# Patient Record
Sex: Male | Born: 1935 | Race: White | Hispanic: No | Marital: Married | Smoking: Never smoker
Health system: Southern US, Community
[De-identification: ages and names within clinical notes are randomized; demographics above are authoritative.]

## PROBLEM LIST (undated history)

## (undated) DIAGNOSIS — G4733 Obstructive sleep apnea (adult) (pediatric): Secondary | ICD-10-CM

## (undated) DIAGNOSIS — I739 Peripheral vascular disease, unspecified: Secondary | ICD-10-CM

## (undated) DIAGNOSIS — E119 Type 2 diabetes mellitus without complications: Secondary | ICD-10-CM

## (undated) DIAGNOSIS — I5030 Unspecified diastolic (congestive) heart failure: Secondary | ICD-10-CM

## (undated) DIAGNOSIS — H548 Legal blindness, as defined in USA: Secondary | ICD-10-CM

## (undated) DIAGNOSIS — R001 Bradycardia, unspecified: Secondary | ICD-10-CM

## (undated) DIAGNOSIS — F0280 Dementia in other diseases classified elsewhere without behavioral disturbance: Secondary | ICD-10-CM

## (undated) DIAGNOSIS — I2510 Atherosclerotic heart disease of native coronary artery without angina pectoris: Secondary | ICD-10-CM

## (undated) DIAGNOSIS — G309 Alzheimer's disease, unspecified: Secondary | ICD-10-CM

## (undated) DIAGNOSIS — E782 Mixed hyperlipidemia: Secondary | ICD-10-CM

## (undated) HISTORY — DX: Dementia in other diseases classified elsewhere, unspecified severity, without behavioral disturbance, psychotic disturbance, mood disturbance, and anxiety: F02.80

## (undated) HISTORY — DX: Bradycardia, unspecified: R00.1

## (undated) HISTORY — PX: CHOLECYSTECTOMY: SHX55

## (undated) HISTORY — DX: Type 2 diabetes mellitus without complications: E11.9

## (undated) HISTORY — DX: Unspecified diastolic (congestive) heart failure: I50.30

## (undated) HISTORY — PX: APPENDECTOMY: SHX54

## (undated) HISTORY — DX: Legal blindness, as defined in USA: H54.8

## (undated) HISTORY — DX: Mixed hyperlipidemia: E78.2

## (undated) HISTORY — DX: Obstructive sleep apnea (adult) (pediatric): G47.33

## (undated) HISTORY — DX: Peripheral vascular disease, unspecified: I73.9

## (undated) HISTORY — DX: Alzheimer's disease, unspecified: G30.9

## (undated) HISTORY — PX: OTHER SURGICAL HISTORY: SHX169

## (undated) HISTORY — DX: Atherosclerotic heart disease of native coronary artery without angina pectoris: I25.10

---

## 1998-07-31 ENCOUNTER — Ambulatory Visit (HOSPITAL_COMMUNITY): Admission: RE | Admit: 1998-07-31 | Discharge: 1998-07-31 | Payer: Self-pay | Admitting: Ophthalmology

## 2005-01-17 ENCOUNTER — Encounter: Payer: Self-pay | Admitting: Cardiology

## 2005-01-17 ENCOUNTER — Inpatient Hospital Stay (HOSPITAL_COMMUNITY): Admission: AD | Admit: 2005-01-17 | Discharge: 2005-01-20 | Payer: Self-pay | Admitting: Cardiology

## 2005-01-17 ENCOUNTER — Ambulatory Visit: Payer: Self-pay | Admitting: Internal Medicine

## 2005-01-17 HISTORY — PX: OTHER SURGICAL HISTORY: SHX169

## 2005-01-29 ENCOUNTER — Ambulatory Visit: Payer: Self-pay

## 2005-01-29 ENCOUNTER — Ambulatory Visit: Payer: Self-pay | Admitting: Internal Medicine

## 2005-01-30 ENCOUNTER — Ambulatory Visit: Payer: Self-pay | Admitting: Cardiology

## 2005-02-05 ENCOUNTER — Ambulatory Visit: Payer: Self-pay | Admitting: Cardiology

## 2005-12-02 ENCOUNTER — Ambulatory Visit: Payer: Self-pay | Admitting: Cardiology

## 2005-12-18 ENCOUNTER — Ambulatory Visit: Payer: Self-pay | Admitting: Cardiology

## 2006-01-13 ENCOUNTER — Ambulatory Visit: Payer: Self-pay | Admitting: Internal Medicine

## 2006-01-28 ENCOUNTER — Ambulatory Visit: Payer: Self-pay | Admitting: Cardiology

## 2006-02-23 ENCOUNTER — Ambulatory Visit: Payer: Self-pay | Admitting: Cardiology

## 2006-05-13 ENCOUNTER — Ambulatory Visit: Payer: Self-pay | Admitting: Internal Medicine

## 2006-12-14 ENCOUNTER — Ambulatory Visit: Payer: Self-pay | Admitting: Cardiology

## 2007-03-24 ENCOUNTER — Ambulatory Visit: Payer: Self-pay | Admitting: Cardiology

## 2007-03-29 ENCOUNTER — Inpatient Hospital Stay (HOSPITAL_BASED_OUTPATIENT_CLINIC_OR_DEPARTMENT_OTHER): Admission: RE | Admit: 2007-03-29 | Discharge: 2007-03-29 | Payer: Self-pay | Admitting: Cardiology

## 2007-03-29 ENCOUNTER — Ambulatory Visit: Payer: Self-pay | Admitting: Cardiology

## 2007-04-01 ENCOUNTER — Inpatient Hospital Stay (HOSPITAL_COMMUNITY): Admission: RE | Admit: 2007-04-01 | Discharge: 2007-04-02 | Payer: Self-pay | Admitting: Cardiology

## 2007-04-01 ENCOUNTER — Ambulatory Visit: Payer: Self-pay | Admitting: Cardiovascular Disease

## 2007-04-20 ENCOUNTER — Ambulatory Visit: Payer: Self-pay | Admitting: Nurse Practitioner

## 2007-06-01 ENCOUNTER — Ambulatory Visit: Payer: Self-pay | Admitting: Cardiology

## 2007-06-07 ENCOUNTER — Ambulatory Visit: Payer: Self-pay | Admitting: Cardiology

## 2007-08-12 ENCOUNTER — Ambulatory Visit: Payer: Self-pay | Admitting: Cardiology

## 2007-12-17 ENCOUNTER — Ambulatory Visit: Payer: Self-pay | Admitting: Cardiology

## 2008-01-21 ENCOUNTER — Ambulatory Visit: Payer: Self-pay | Admitting: Cardiology

## 2008-02-18 ENCOUNTER — Ambulatory Visit: Payer: Self-pay | Admitting: Cardiology

## 2008-02-25 ENCOUNTER — Ambulatory Visit: Payer: Self-pay | Admitting: Internal Medicine

## 2008-03-06 ENCOUNTER — Ambulatory Visit: Payer: Self-pay | Admitting: Cardiology

## 2008-06-15 ENCOUNTER — Ambulatory Visit: Payer: Self-pay | Admitting: Internal Medicine

## 2008-09-19 ENCOUNTER — Ambulatory Visit: Payer: Self-pay | Admitting: Internal Medicine

## 2008-10-03 ENCOUNTER — Ambulatory Visit: Payer: Self-pay | Admitting: Cardiology

## 2009-01-19 ENCOUNTER — Ambulatory Visit: Payer: Self-pay | Admitting: Internal Medicine

## 2009-01-19 ENCOUNTER — Encounter: Payer: Self-pay | Admitting: Cardiology

## 2009-01-22 ENCOUNTER — Encounter: Payer: Self-pay | Admitting: Internal Medicine

## 2009-01-23 ENCOUNTER — Encounter: Payer: Self-pay | Admitting: Cardiology

## 2009-02-19 ENCOUNTER — Encounter: Payer: Self-pay | Admitting: Cardiology

## 2009-03-22 ENCOUNTER — Ambulatory Visit: Payer: Self-pay | Admitting: Cardiology

## 2009-03-24 ENCOUNTER — Encounter: Payer: Self-pay | Admitting: Cardiology

## 2009-05-23 ENCOUNTER — Encounter: Payer: Self-pay | Admitting: Cardiology

## 2009-07-11 ENCOUNTER — Ambulatory Visit: Payer: Self-pay | Admitting: Internal Medicine

## 2009-08-23 ENCOUNTER — Ambulatory Visit: Payer: Self-pay | Admitting: Cardiology

## 2009-08-23 ENCOUNTER — Encounter: Payer: Self-pay | Admitting: Cardiology

## 2009-08-23 ENCOUNTER — Encounter: Payer: Self-pay | Admitting: Physician Assistant

## 2009-08-31 ENCOUNTER — Encounter: Payer: Self-pay | Admitting: Cardiology

## 2009-09-26 ENCOUNTER — Encounter: Payer: Self-pay | Admitting: Cardiology

## 2009-09-26 DIAGNOSIS — I5031 Acute diastolic (congestive) heart failure: Secondary | ICD-10-CM | POA: Insufficient documentation

## 2009-09-26 DIAGNOSIS — F0280 Dementia in other diseases classified elsewhere without behavioral disturbance: Secondary | ICD-10-CM | POA: Insufficient documentation

## 2009-09-26 DIAGNOSIS — I2510 Atherosclerotic heart disease of native coronary artery without angina pectoris: Secondary | ICD-10-CM

## 2009-09-26 DIAGNOSIS — Z9861 Coronary angioplasty status: Secondary | ICD-10-CM

## 2009-09-26 DIAGNOSIS — E1169 Type 2 diabetes mellitus with other specified complication: Secondary | ICD-10-CM

## 2009-09-26 DIAGNOSIS — Z950 Presence of cardiac pacemaker: Secondary | ICD-10-CM | POA: Insufficient documentation

## 2009-09-26 DIAGNOSIS — H548 Legal blindness, as defined in USA: Secondary | ICD-10-CM

## 2009-09-26 DIAGNOSIS — I5033 Acute on chronic diastolic (congestive) heart failure: Secondary | ICD-10-CM

## 2009-09-26 DIAGNOSIS — E785 Hyperlipidemia, unspecified: Secondary | ICD-10-CM | POA: Insufficient documentation

## 2009-09-26 DIAGNOSIS — E7800 Pure hypercholesterolemia, unspecified: Secondary | ICD-10-CM

## 2009-09-26 DIAGNOSIS — E871 Hypo-osmolality and hyponatremia: Secondary | ICD-10-CM | POA: Insufficient documentation

## 2009-09-26 DIAGNOSIS — G309 Alzheimer's disease, unspecified: Secondary | ICD-10-CM

## 2009-09-26 DIAGNOSIS — I739 Peripheral vascular disease, unspecified: Secondary | ICD-10-CM

## 2009-09-26 DIAGNOSIS — I509 Heart failure, unspecified: Secondary | ICD-10-CM | POA: Insufficient documentation

## 2009-09-26 DIAGNOSIS — I129 Hypertensive chronic kidney disease with stage 1 through stage 4 chronic kidney disease, or unspecified chronic kidney disease: Secondary | ICD-10-CM

## 2009-09-26 DIAGNOSIS — G4733 Obstructive sleep apnea (adult) (pediatric): Secondary | ICD-10-CM

## 2009-09-26 DIAGNOSIS — H3530 Unspecified macular degeneration: Secondary | ICD-10-CM | POA: Insufficient documentation

## 2009-09-27 ENCOUNTER — Ambulatory Visit: Payer: Self-pay | Admitting: Cardiology

## 2009-09-27 DIAGNOSIS — I10 Essential (primary) hypertension: Secondary | ICD-10-CM

## 2010-01-18 ENCOUNTER — Ambulatory Visit: Payer: Self-pay | Admitting: Internal Medicine

## 2010-10-18 ENCOUNTER — Ambulatory Visit: Payer: Self-pay | Admitting: Internal Medicine

## 2010-11-27 ENCOUNTER — Encounter: Payer: Self-pay | Admitting: Cardiology

## 2010-12-02 ENCOUNTER — Encounter: Payer: Self-pay | Admitting: Cardiology

## 2010-12-06 ENCOUNTER — Encounter: Payer: Self-pay | Admitting: Cardiology

## 2011-01-22 ENCOUNTER — Ambulatory Visit
Admission: RE | Admit: 2011-01-22 | Discharge: 2011-01-22 | Payer: Self-pay | Source: Home / Self Care | Attending: Cardiology | Admitting: Cardiology

## 2011-01-22 ENCOUNTER — Encounter: Payer: Self-pay | Admitting: Cardiology

## 2011-01-28 ENCOUNTER — Encounter: Payer: Self-pay | Admitting: Internal Medicine

## 2011-01-28 NOTE — Procedures (Signed)
Summary: pacer check/sl    Allergies: 1)  ! * Gabapentin 2)  ! * Diclofenac   PPM Specifications Following MD:  Hillis Range, MD     PPM Vendor:  [employer]onic     PPM Model Number:  [id number]     PPM Serial Number:  [id number] PPM DOI:  01/17/2005     PPM Implanting MD:  Sherryl Manges, MD  Lead 1    Location: RA     DOI: 01/17/2005     Model #: [id number]     Serial #: [id number]     Status: active Lead 2    Location: RV     DOI: 01/17/2005     Model #: [id number]     Serial #: [id number]     Status: active  Magnet Response Rate:  BOL 85 ERI 65  Indications:  BRADYCARDIA   PPM Follow Up Remote Check?  No Battery Voltage:  2.79 V     Battery Est. Longevity:  9.5 YEARS     Pacer Dependent:  No       PPM Device Measurements Atrium  Amplitude: 2.8 mV, Impedance: 479 ohms, Threshold: 0.5 V at 0.4 msec Right Ventricle  Amplitude: 11.2 mV, Impedance: 413 ohms, Threshold: 1.5 V at 0.4 msec  Episodes MS Episodes:  168     Percent Mode Switch:  0.2%     Coumadin:  No Ventricular High Rate:  6     Atrial Pacing:  4.4%     Ventricular Pacing:  1.3%  Parameters Mode:  DDDR     Lower Rate Limit:  60     Upper Rate Limit:  120 Paced AV Delay:  280     Sensed AV Delay:  250 Next Cardiology Appt Due:  06/28/2010 Tech Comments:  Normal device function.  EGM collection changed to [facility] today.  Longest mode switch episode was 3 minutes.  ROV 6 months Dr Johney Frame. Gypsy Balsam RN BSN  January 18, 2010 5:48 PM

## 2011-01-28 NOTE — Cardiovascular Report (Signed)
Summary: Office Visit   Office Visit   Imported By: Roderic Ovens 02/21/2010 16:04:35  _____________________________________________________________________  External Attachment:    Type:   Image     Comment:   External Document

## 2011-01-28 NOTE — Cardiovascular Report (Signed)
Summary: Office Visit   Office Visit   Imported By: Roderic Ovens 10/28/2010 16:06:29  _____________________________________________________________________  External Attachment:    Type:   Image     Comment:   External Document

## 2011-01-28 NOTE — Assessment & Plan Note (Signed)
Summary: PACER CLINIC  Medications Added BENAZEPRIL HCL 40 MG TABS (BENAZEPRIL HCL) Take 1 tablet by mouth once daily PRAVASTATIN SODIUM 20 MG TABS (PRAVASTATIN SODIUM) Take 1 tablet by mouth once daily COQ10 50 MG CAPS (COENZYME Q10) Take 1 capsule by mouth once daily CITRUCEL  PACK (METHYLCELLULOSE (LAXATIVE)) Take 1 teaspoon by mouth every morning CENTRUM SILVER  TABS (MULTIPLE VITAMINS-MINERALS) Take 1 tablet by mouth once daily GLIMEPIRIDE 2 MG TABS (GLIMEPIRIDE) Take 1 tablet by mouth once daily FISH OIL 300 MG CAPS (OMEGA-3 FATTY ACIDS) Take 2100 mg by mouth once daily CINNAMON 500 MG TABS (CINNAMON) Take 2 tablet by mouth once daily      Allergies Added:   Current Medications (verified): 1)  Aspirin 325 Mg Tabs (Aspirin) .... Daily 2)  Bumetanide 1 Mg Tabs (Bumetanide) .... Every Other Day 3)  Ultram 50 Mg Tabs (Tramadol Hcl) .... Two Times A Day 4)  Magnesium Oxide 250 Mg Tabs (Magnesium Oxide) .... Daily 5)  Potassium Chloride Cr 10 Meq Cr-Caps (Potassium Chloride) .... Take One Tablet By Mouth Daily 6)  Doxazosin Mesylate 8 Mg Tabs (Doxazosin Mesylate) .... Take 1/2 Tablet By Mouth Once A Day 7)  Humulin N 100 Unit/ml Susp (Insulin Isophane Human) .... 28u Subcutaneously in Am and 18u Subcutaneously Ay Night 8)  Amlodipine Besylate 10 Mg Tabs (Amlodipine Besylate) .... Take One Tablet By Mouth Daily (Place On File) 9)  Ocuvite  Tabs (Multiple Vitamins-Minerals) .... Take 1 Tablet By Mouth Two Times A Day 10)  Lyrica 75 Mg Caps (Pregabalin) .... Take 1 Tablet By Mouth Once A Day At Bedtime 11)  Aricept 10 Mg Tabs (Donepezil Hcl) .... Take 1 Tablet By Mouth Once A Day At Bedtime 12)  Benazepril Hcl 40 Mg Tabs (Benazepril Hcl) .... Take 1 Tablet By Mouth Once Daily 13)  Pravastatin Sodium 20 Mg Tabs (Pravastatin Sodium) .... Take 1 Tablet By Mouth Once Daily 14)  Coq10 50 Mg Caps (Coenzyme Q10) .... Take 1 Capsule By Mouth Once Daily 15)  Citrucel  Pack (Methylcellulose  (Laxative)) .... Take 1 Teaspoon By Mouth Every Morning 16)  Centrum Silver  Tabs (Multiple Vitamins-Minerals) .... Take 1 Tablet By Mouth Once Daily 17)  Glimepiride 2 Mg Tabs (Glimepiride) .... Take 1 Tablet By Mouth Once Daily 18)  Fish Oil 300 Mg Caps (Omega-3 Fatty Acids) .... Take 2100 Mg By Mouth Once Daily 19)  Cinnamon 500 Mg Tabs (Cinnamon) .... Take 2 Tablet By Mouth Once Daily  Allergies (verified): 1)  ! * Gabapentin 2)  ! * Diclofenac   PPM Specifications Following MD:  Hillis Range, MD     PPM Vendor:  [employer]     PPM Model Number:  [id number]     PPM Serial Number:  [id number] PPM DOI:  01/17/2005     PPM Implanting MD:  Sherryl Manges, MD  Lead 1    Location: RA     DOI: 01/17/2005     Model #: [id number]     Serial #: [id number]     Status: active Lead 2    Location: RV     DOI: 01/17/2005     Model #: [id number]     Serial #: [id number]     Status: active  Magnet Response Rate:  BOL 85 ERI 65  Indications:  BRADYCARDIA   PPM Follow Up Battery Voltage:  2.79 V     Battery Est. Longevity:  8.5 yrs     Pacer Dependent:  No  PPM Device Measurements Atrium  Amplitude: 2.80 mV, Impedance: 451 ohms, Threshold: 0.50 V at 0.40 msec Right Ventricle  Amplitude: 11.20 mV, Impedance: 436 ohms, Threshold: 1.00 V at 0.40 msec  Episodes MS Episodes:  275     Percent Mode Switch:  0.1%     Coumadin:  No Ventricular High Rate:  8     Atrial Pacing:  8.2%     Ventricular Pacing:  4.4%  Parameters Mode:  DDDR     Lower Rate Limit:  60     Upper Rate Limit:  120 Paced AV Delay:  280     Sensed AV Delay:  250 Next Cardiology Appt Due:  03/31/2011 Tech Comments:  275 AHR EPISODES--LONGEST WAS 6 MINUTES 24 SECONDS.  8 VHR EPISODES--LONGEST WAS 3 SECONDS.  NORMAL DEVICE FUNCTION.  NO CHANGES MADE. ROV IN 6 MTHS W/JA. Vella Kohler  October 18, 2010 12:09 PM

## 2011-01-30 NOTE — Letter (Signed)
Summary: External Correspondence/ OFFICE VISIT [facility]  External Correspondence/ [facility]ICE VISIT [facility]   Imported By: Dorise Hiss 12/19/2010 14:51:55  _____________________________________________________________________  External Attachment:    Type:   Image     Comment:   External Document

## 2011-01-30 NOTE — Assessment & Plan Note (Signed)
Summary: 3 Y R FU LAST SEEN 09/10  Medications Added BUMETANIDE 1 MG TABS (BUMETANIDE) Take 1 tablet by mouth once a day NOVOLIN N 100 UNIT/ML SUSP (INSULIN ISOPHANE HUMAN) use as directed CALTRATE 600+D 600-400 MG-UNIT TABS (CALCIUM CARBONATE-VITAMIN D) Take 1 tablet by mouth once a day      Allergies Added:   Visit Type:  Follow-up Primary Provider:  Dr.[name]   History of Present Illness: the patient is an 77 year old male with a history of bare metal stent to the circumflex in 2008.  He has done well with no recurrent chest pain.  He is limited in activity with decreased exercise tolerance.  He has chronic diastolic heart failure with LV function is normal with an ejection fraction of 60%.  His current doing well on his current dose of diuretic therapy.  The patient status post pacemaker implantation.  His EKG with and without magnet demonstrated a normal magnet rate of 85 beats/min.  He had a recent echocardiogram done and his primary care physician's office after he was noted to have some wheezing.  He was found to have mild to moderate tricuspid regurgitation and mild to moderate pulmonary hypertension with PA systolic pressure 40 to 50 mm of mercury.  Currently he is breathing much better.  He also has obstructive sleep apnea nasal CPAP at night.  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 Daily 2)  Bumetanide 1 Mg Tabs (Bumetanide) .... Take 1 Tablet By Mouth Once A Day 3)  Ultram 50 Mg Tabs (Tramadol Hcl) .... Two Times A Day 4)  Magnesium Oxide 250 Mg Tabs (Magnesium Oxide) .... Daily 5)  Doxazosin Mesylate 8 Mg Tabs (Doxazosin Mesylate) .... Take 1/2 Tablet By Mouth Once A Day 6)  Novolin N 100 Unit/ml Susp (Insulin Isophane Human) .... Use As Directed 7)   Amlodipine Besylate 10 Mg Tabs (Amlodipine Besylate) .... Take One Tablet By Mouth Daily (Place On File) 8)  Ocuvite  Tabs (Multiple Vitamins-Minerals) .... Take 1 Tablet By Mouth Two Times A Day 9)  Lyrica 75 Mg Caps (Pregabalin) .... Take 1 Tablet By Mouth Once A Day At Bedtime 10)  Aricept 10 Mg Tabs (Donepezil Hcl) .... Take 1 Tablet By Mouth Once A Day At Bedtime 11)  Benazepril Hcl 40 Mg Tabs (Benazepril Hcl) .... Take 1 Tablet By Mouth Once Daily 12)  Pravastatin Sodium 20 Mg Tabs (Pravastatin Sodium) .... Take 1 Tablet By Mouth Once Daily 13)  Coq10 50 Mg Caps (Coenzyme Q10) .... Take 1 Capsule By Mouth Once Daily 14)  Citrucel  Pack (Methylcellulose (Laxative)) .... Take 1 Teaspoon By Mouth Every Morning 15)  Centrum Silver  Tabs (Multiple Vitamins-Minerals) .... Take 1 Tablet By Mouth Once Daily 16)  Glimepiride 2 Mg Tabs (Glimepiride) .... Take 1 Tablet By Mouth Once Daily 17)  Fish Oil 300 Mg Caps (Omega-3 Fatty Acids) .... Take 2100 Mg By Mouth Once Daily 18)  Cinnamon 500 Mg Tabs (Cinnamon) .... Take 2 Tablet By Mouth Once Daily 19)  Caltrate 600+d 600-400 Mg-Unit Tabs (Calcium Carbonate-Vitamin D) .... Take 1 Tablet By Mouth Once A Day  Allergies (verified): 1)  ! * Gabapentin 2)  ! * Diclofenac  Comments:  Nurse/Medical Assistant: The patient's medication list and allergies were reviewed with the patient and were updated  in the Medication and Allergy Lists.  Past History:  Past Medical History: Last updated: 09/26/2009 PURE HYPERCHOLESTEROLEMIA (ICD-272.0) CARDIAC PACEMAKER IN SITU (ICD-V45.01) ACUTE DIASTOLIC HEART FAILURE (ICD-428.31) ACUTE ON CHRONIC DIASTOLIC HEART FAILURE (ICD-428.33) HYPOSMOLALITY AND/OR HYPONATREMIA (ICD-276.1) CHF (ICD-428.0) CORONARY ATHEROSCLEROSIS NATIVE CORONARY ARTERY (ICD-414.01) POSTSURG PERCUT TRANSLUMINAL COR ANGPLSTY STS (ICD-V45.82) HYPERLIPIDEMIA (ICD-272.4) OBSTRUCTIVE SLEEP APNEA (ICD-327.23) HTN CKD UNS W/CKD STAGE I THRU  STAGE IV/UNS (ICD-403.90) MACULAR DEGENERATION OF RETINA UNSPECIFIED (ICD-362.50) LEGAL BLINDNESS, AS DEFINED IN [place] (ICD-369.4) PVD (ICD-443.9) ALZHEIMERS DISEASE (ICD-331.0) DM (ICD-250.00) 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  The patient denies malaise, fever, weight gain/loss, decreased hearing, hoarseness, chest pain, palpitations, shortness of breath, prolonged cough, wheezing, sleep apnea, coughing up blood, abdominal pain, blood in stool, nausea, vomiting, diarrhea, heartburn, incontinence, blood in urine, joint pain, leg swelling, rash, skin lesions, headache, fainting, dizziness, depression, anxiety, enlarged lymph nodes, easy bruising or bleeding, and environmental allergies.    Vital Signs:  Patient profile:   77 year old male Height:      71 inches Weight:      213 pounds BMI:     29.81 Pulse rate:   66 / minute BP sitting:   146 / 71  (left arm) Cuff size:   large  Vitals Entered By: Carlye Grippe (January 22, 2011 3:06 PM)  Physical Exam  Additional Exam:  General: Well-developed, well-nourished in no distress head: Normocephalic and atraumatic eyes PERRLA/EOMI intact, conjunctiva and lids normal nose: No deformity or lesions mouth normal dentition, normal posterior pharynx neck: Supple, no JVD.  No masses, thyromegaly or abnormal cervical nodes lungs: Normal breath sounds bilaterally without wheezing.  Normal percussion heart: regular rate and rhythm with normal S1 and S2, no S3 or S4.  PMI is normal.  No pathological murmurs abdomen: Normal  bowel sounds, abdomen is soft and nontender without masses, organomegaly or hernias noted.  No hepatosplenomegaly musculoskeletal: Back normal, normal gait muscle strength and tone normal pulsus: Pulse is normal in all 4 extremities Extremities: No peripheral pitting edema neurologic: Alert and oriented x 3 skin: numerous cancerous lesions which are being treated cervical nodes: No significant adenopathy psychologic: Normal affect    PPM Specifications Following MD:  Hillis Range, MD     PPM Vendor:  [employer]     PPM Model Number:  [id number]     PPM Serial Number:  [id number] PPM DOI:  01/17/2005     PPM Implanting MD:  Sherryl Manges, MD  Lead 1    Location: RA     DOI: 01/17/2005     Model #: 1478     Serial #: [id number]     Status: active Lead 2    Location: RV     DOI: 01/17/2005     Model #: 6578     Serial #: [id number]     Status: active  Magnet Response Rate:  BOL 85 ERI 65  Indications:  BRADYCARDIA   PPM Follow Up Pacer Dependent:  No      Episodes Coumadin:  No  Parameters Mode:  DDDR     Lower Rate Limit:  60  Upper Rate Limit:  120 Paced AV Delay:  280     Sensed AV Delay:  250  Impression & Recommendations:  Problem # 1:  ESSENTIAL HYPERTENSION, BENIGN (ICD-401.1) blood pressure control to make no changes in his medical regimen His updated medication list for this problem includes:    Aspirin 325 Mg Tabs (Aspirin) .Marland Kitchen... Daily    Bumetanide 1 Mg Tabs (Bumetanide) .Marland Kitchen... Take 1 tablet by mouth once a day    Doxazosin Mesylate 8 Mg Tabs (Doxazosin mesylate) .Marland Kitchen... Take 1/2 tablet by mouth once a day    Amlodipine Besylate 10 Mg Tabs (Amlodipine besylate) .Marland Kitchen... Take one tablet by mouth daily (place on file)    Benazepril Hcl 40 Mg Tabs (Benazepril hcl) .Marland Kitchen... Take 1 tablet by mouth once daily  Problem # 2:  CARDIAC PACEMAKER IN SITU (ICD-V45.01) pacemaker function was evaluated with an EKG with and without magnet.  The magnet rate is 85 beats/min Orders: EKG w/  Interpretation (93000)  Problem # 3:  LEGAL BLINDNESS, AS DEFINED IN [place] (ICD-369.4) Assessment: Comment Only  Problem # 4:  CORONARY ATHEROSCLEROSIS NATIVE CORONARY ARTERY (ICD-414.01) the patient has no recurrent chest pain.  He is a stent placed in 2008.  No further ischemia evaluation is required currently His updated medication list for this problem includes:    Aspirin 325 Mg Tabs (Aspirin) .Marland Kitchen... Daily    Amlodipine Besylate 10 Mg Tabs (Amlodipine besylate) .Marland Kitchen... Take one tablet by mouth daily (place on file)    Benazepril Hcl 40 Mg Tabs (Benazepril hcl) .Marland Kitchen... Take 1 tablet by mouth once daily  Orders: EKG w/ Interpretation (93000)  Patient Instructions: 1)  Your physician recommends that you continue on your current medications as directed. Please refer to the Current Medication list given to you today. 2)  Follow up in  6 months

## 2011-01-30 NOTE — Letter (Signed)
Summary: External Correspondence/ OFFICE VISIT [facility]  External Correspondence/ [facility]ICE V[facility]IT [facility]   Imported By: Dorise Hiss 12/19/2010 14:53:32  _____________________________________________________________________  External Attachment:    Type:   Image     Comment:   External Document

## 2011-02-19 NOTE — Cardiovascular Report (Signed)
Summary: TTM   TTM   Imported By: Roderic Ovens 02/14/2011 12:01:19  _____________________________________________________________________  External Attachment:    Type:   Image     Comment:   External Document

## 2011-04-30 ENCOUNTER — Encounter: Payer: Self-pay | Admitting: Internal Medicine

## 2011-04-30 DIAGNOSIS — I498 Other specified cardiac arrhythmias: Secondary | ICD-10-CM

## 2011-05-13 NOTE — Assessment & Plan Note (Signed)
[facility]                          [facility] OFF[facility] NOTE   NAME:[name] [name], [name] [name]                        MRN:          [id number]  DATE:02/18/2008                            DOB:          [birth year]    REFERRING PHYSICIAN:  Doreen Beam, MD   HISTORY OF PRESENT ILLNESS:  The patient is an 77 year old male with  history of coronary artery disease, sinus node dysfunction, status post  pacemaker implantation.  The patient is also legally blind.   The patient stated he was hospitalized in January of this year because  of an episode of dyspnea, which occurred rather suddenly.  He was ruled  out during a brief hospitalization, was seen by cardiology fellow, Dr.  Beryl Meager.  It was felt that the patient could be discharged without  any further testing.  Note that the patient has also been diagnosed with  sleep apnea in the interim and is on CPAP.  The patient did not have any  ischemia testing over the last three years.   MEDICATIONS:  See list.   PHYSICAL EXAMINATION:  VITAL SIGNS:  Blood pressure 137/70, heart rate  65, weight is 195 pounds.  NECK EXAM:  Normal carotid upstroke and no carotid bruits.  LUNGS:  Clear breath sounds bilaterally.  HEART:  Regular rate and rhythm, normal S1, S2, no murmurs, rubs or  gallops.  ABDOMEN:  Soft, nontender, no rebound or guarding and good bowel sounds.  EXTREMITY EXAM:  No cyanosis, clubbing or edema.  NEUROLOGIC:  Patient is alert and oriented and grossly nonfocal.   PROBLEM LIST:  1. Coronary artery disease.      a.     Status post bare metal stent to mid-circumflex, April 01, 2007.      b.     Residual coronary artery disease, March 29, 2007.  See       details in previous notes.      c.     Normal LV function.  2. Mild peripheral edema, stable.  3. Sinus node dysfunction and syncope, status post pacemaker      implantation, 2006.  4. Hypertension.  5. Diabetes mellitus.  6. History of  pleural effusion, status post thoracentesis.  7. Moderate pulmonary hypertension.  8. Status post aspiration pneumonia.  9. Macular degeneration, legally blind.  10.Treated dyslipidemia.  11.Peripheral vascular disease.  12.Chronic renal insufficiency.  13.Sleep apnea, on CPAP.   PLAN:  1. The patient will be started back on Zocor/simvastatin 20 mg p.o.      daily.  2. Liver function tests, lipid panel, as well as CBC and BMET, will be      drawn.  3. In particular, the episode of dyspnea in January was concerning and      the patient will be referred for a Cardiolite study.  We will also      start him on p.r.n. nitroglycerin.  4. The patient complains of significant arthritic complaints and I      have given him prescription for Ultram to use p.r.n. pain.  5. The patient will have pacemaker interrogation apparently next      month, as related to me by the wife.  6. Patient will follow up with me in six months.     Learta Codding, MD,FACC  Electronically Signed    GED/MedQ  DD: 02/18/2008  DT: 02/18/2008  Job #: [id number]   cc:   Doreen Beam, MD

## 2011-05-13 NOTE — Assessment & Plan Note (Signed)
[facility]                          [facility] OFF[facility] NOTE   NAME:[name] [name]                        MRN:          [id number]  DATE:06/07/2007                            DOB:          1921/08/28    [name] [name] is actually doing quite well. His meds had been adjusted during  a visit on June 01, 2007 and his Bumex dose was increased. He has  diuresed. His edema in decreased. He feels somewhat better. His recorded  weight today is 191 pounds which appears to be down 3 pounds since the  last visit on June 01, 2007. Also he had a BMET  in followup and a chest  x-ray today. The chest x-ray shows no effusions and no signs of heart  failure. His creatinine is up to 1.5 and BUN 34. It will have to be  followed in this range. Potassium is 4.6.   PAST MEDICAL HISTORY:   ALLERGIES:  No known drug allergies.   MEDICATIONS:  1. Aspirin 325.  2. Actos 15.  3. Citrucel.  4. Ocuvite.  5. Multivitamin.  6. Zocor 40.  7. Doxazosin 2 mg.  8. Lyrica 150.  9. Potassium 10 mEq.  10.Amlodipine.  11.Benazepril 5/20.  12.Humulin.  13.Calcium.  14.Vitamin C.  15.Omega-3.  16.Bumex 2 mg in the morning and one in the evening.   OTHER MEDICAL PROBLEMS:  See the list below.   REVIEW OF SYSTEMS:  He does have some fatigue. Overall, however he is  relativity stable. Review of systems otherwise is negative.   PHYSICAL EXAMINATION:  Today blood pressure is 127/58 with a pulse of  65, and his weight is 191 pounds. The patient is oriented to person,  time, and place. Affect is normal. He is here with his wife who was in  the room throughout the evaluation.  HEENT: Reveals no xanthelasma. The patient is legally blind.  He has no carotid bruits. There is no jugular venous distension.  CARDIAC EXAM: Reveals an S1 with an S2. There are no clicks or  significant murmurs.  LUNGS: Clear. Respiratory effort is not labored.  ABDOMEN: Soft. There are no masses or bruits.  He does have 1 + edema under his support hose but this is definitely  improved.   EKG: Reveals that his rhythm is atrially paced without the magnet and  atrial paced with the magnet.   BUN is 34, creatinine 1.5, glucose was 237 as he ate something before  his study to be sure that he did not become hypoglycemic while at that  hospital having a chest x-ray and his labs drawn. BNP was 95.   PROBLEMS:  Include;  1. Coronary disease, post PCI  in April of 2008.  2. History of sinus node dysfunction and syncope and a pacemaker in      2006.  3. Hypertension.  4. Diabetes.  5. History of a pleural effusion with a thoracentesis in February of      2007. Current chest x-ray shows no pleural effusion.  6. Moderate pulmonary hypertension.  7. History of aspiration pneumonia.  8. Macular degeneration, legally blind.  9. Hypercholesterolemia.  10.Peripheral vascular disease with claudication bilateral femoral      bruits.  11.Mild increased creatinine in the setting of his diureses.  12.Peripheral edema.   With his BNP at 95 it is possible that his fluid overload is partially  venous. I have chosen to leave him on his current dose of diuretic. I  spoke with him about limiting salt and fluid intake. He will have a  follow up BMET in 6 weeks and be seen in the office in 8 weeks by Dr.  Andee [name].     [name] Abed, MD, [facility]  Electronically Signed    JDK/MedQ  DD: 06/07/2007  DT: 06/07/2007  Job #: [id number]   cc:   [name] [name]

## 2011-05-13 NOTE — Assessment & Plan Note (Signed)
[facility]                          [facility]   NAME:[name] [name] WORTMANN                        MRN:          [id number]  DATE:08/12/2007                            DOB:          1921/07/02    CARDIOLOGIST:  Learta Codding, MD   PRIMARY CARE PHYSICIAN:  Doreen Beam, MD   ELECTROPHYSIOLOGIST:  Duke Salvia, MD   HISTORY OF PRESENT ILLNESS:  [name] [name] is an 77 year old male patient  with a history of coronary artery disease as well as sinus node  dysfunction, status post pacemaker implantation, who presents to the  office today for follow-up.  He was last seen by Dr. Myrtis Ser on June 07, 2007.  Please see that dictated note for complete details.  He has had  some difficulty with peripheral edema, which has been treated with oral  diuretics.  This seems to be stable and he does have chronic renal  insufficiency, which also seems to be stable.  Today in the office he  notes he is doing well without any chest pain.  He does have minimal  shortness of breath with exertion.  He describes NYHA class II to class  IIB symptoms.  He still continues to work out with cardiac rehab.  He  apparently had some low blood pressures several weeks ago and Dr. Sherril Croon  saw him and decreased his Lotrel dose.  He denies any syncope.  Denies  any orthopnea, PND or pedal edema.  His wife does note that he does not  snore but he has some episodes that appear to be apneic episodes from  time to time.  He admits to some fatigue.  He also awakens with a  history from time to time.   CURRENT MEDICATIONS:  1. Humulin-N 24 units in the morning, 14 units at supper.  2. Calcium.  3. Vitamin C.  4. Omega-3.  5. Bumex 2 mg in the morning, 1 mg in the evening.  6. Centrum A to Z.  7. Citrucel.  8. Amlodipine/benazepril 5/20 mg daily.  9. Doxazosin 5 mg half a tablet daily.  10.Aspirin 325 mg daily.  11.Actos 15 mg daily.  12.Ocuvite vitamin.  13.Multivitamin.  14.Zocor  20 mg q.h.s.  15.Lyrica 75 mg two tablets q.h.s.  16.Potassium 10 mEq daily.  17.Nitroglycerin p.r.n. chest pain.   ALLERGIES:  No known drug allergies.   PHYSICAL EXAMINATION:  He is a well-nourished, well-developed, elderly  male in no acute distress.  Blood pressure is 138/68, pulse 68, weight 191.8 pounds.  HEENT:  Normal.  NECK:  Without JVD.  CARDIAC:  Normal S1, S2.  Regular rate and rhythm.  LUNGS:  Clear to auscultation bilaterally without wheeze, rhonchi or  rales.  No egophony.  ABDOMEN:  Soft, nontender, with normoactive bowel sounds, no  organomegaly.  EXTREMITIES:  With trace to 1+ edema bilaterally.  Calves are soft and  nontender.  SKIN:  Warm and dry.  NEUROLOGIC:  He is alert and oriented x3.  Cranial nerves II-XII are  grossly intact.   DATA BASE:  Recent lab work done  by Dr. Sherril Croon reveals a stable BUN and  creatinine at 36 and 1.6, respectively.  His LFTs were normal and his  HDL is 60, LDL is 65, total cholesterol 134, triglycerides 43.  TSH  normal at 0.69.  Hemoglobin mildly depressed at 11.3, MCV normal at  85.6.   IMPRESSION:  1. Coronary artery disease.      a.     Status post bare metal stenting to the mid circumflex April 01, 2007.      b.     Residual coronary artery disease at the time of       catheterization March 29, 2007, LAD mid 40%, distal 60-70%, second       diagonal mid 40%, and minimal scattered plaque elsewhere.  2. Good left ventricular function.  3. Peripheral edema, stable.  4. Sinus node dysfunction and syncope, status post pacemaker      implantation in 2006.  5. Hypertension.  6. Diabetes mellitus.  7. History of pleural effusion.      a.     Status post thoracentesis February 2007.  8. Moderate pulmonary hypertension.  9. History of aspiration pneumonia.  10.Macular degeneration, legally blind.  11.Treated dyslipidemia.  12.Peripheral arterial disease.  13.Chronic renal insufficiency, stable.  14.Fatigue.      a.      Rule out sleep apnea.   PLAN:  The patient returns to the office today for follow-up.  Overall  he is doing well.  His peripheral edema seems to be well-controlled.  He  is not having any chest pain or shortness of breath.  He is in need of  follow-up with the device clinic.  At this point in time we plan to:   1. Proceed with apnea link testing to screen for possibility of sleep      apnea.  2. Set him up with a follow-up appointment with Dr. Graciela Husbands the next      time he is in the [place]      office.  3. Follow up with Dr. Andee Lineman in the next 6 months.      [name] Newcomer, PA-C  Electronically Signed      Luis Abed, MD, [facility]  Electronically Signed   SW/MedQ  DD: 08/12/2007  DT: 08/13/2007  Job #: [id number]   cc:   Doreen Beam

## 2011-05-13 NOTE — Assessment & Plan Note (Signed)
[facility]                          [facility]   NAME:[name], [name] CULBREATH                        MRN:          [id number]  DATE:06/07/2007                            DOB:          1921/06/25    ADDENDUM:   In the medication list, I failed to list all of the patient's medicines.  The complete medication list is:  1. Aspirin 325.  2. Actos 15.  3. Citrucel.  4. Ocuvite.  5. Multivitamin.  6. Zocor 40.  7. Doxazosin 2 mg.  8. Lyrica 150.  9. Potassium 10 mEq.  10.Amlodipine.  11.Benazepril 5/20.  12.Humulin.  13.Calcium.  14.Vitamin C.  15.Omega-3.  16.Bumex 2 mg in the morning and one in the evening.     Luis Abed, MD, [facility]  Electronically Signed    JDK/MedQ  DD: 06/07/2007  DT: 06/07/2007  Job #: [id number]

## 2011-05-13 NOTE — Assessment & Plan Note (Signed)
[facility]                          [facility]   NAME:[name] [name], [name] [name]                        MRN:          [id number]  DATE:06/01/2007                            DOB:          August 15, 1921    PRIMARY CARE PHYSICIAN:  Dr. Sherril Croon.   SUMMARY OF HISTORY:  [name] [name] is well-known to our service and returns  in followup from his previous office visit on April 20, 2007 with  Dorian Pod, ACNP, in regards to his lower extremity edema.  Since  his last office visit on April 20, 2007, he states that he continues to  have problems with lower extremity edema.  He does not feel that it is  any worse or any better than previously.  He states that he has gained  weight since his last office visit, but he attributes this to problems  with his sugars.  He has had multiple episodes of hypoglycemia and is  having to eat more.  He denies any problems with chest discomfort,  orthopnea, PND, or dyspnea.  He does describe fatigue.  He does exercise  3 times a week without difficulty.  During activities, he may have some  fatigue, but he does not have any difficulty completing the activities.  He finds that he has decreased energy or motivation to do the  activities.  He attributes this to his macular degeneration.  He also is  complaining that he is unable to sleep at night.  However, it is noted  that he is dozing several times during the day.   PAST MEDICAL HISTORY:  No known drug allergies.   CURRENT MEDICATIONS:  1. Include amlodipine/benazepril 5/20 b.i.d.  2. Humulin N 24 units in the morning, Humulin N 14 units at night.  3. Calcium 1000 mg daily.  4. Vitamin C 500 mg daily.  5. Omega 3 daily.  6. Aspirin 325 daily.  7. Actos 15 mg daily.  8. Citrucel 1 teaspoon daily.  9. Ocuvite 2 mg daily.  10.Multivitamin daily.  11.Zocor 40 mg nightly.  12.Bumex 1 mg b.i.d.  13.Doxazosin 4 mg 1/2 tablet daily.  14.Lyrica 75 mg 2 tablets nightly.  15.Potassium 10 mEq daily.  16.Nitroglycerin 0.4 p.r.n.   PAST MEDICAL HISTORY:  Notable for coronary artery disease with PTCA  stenting to the circumflex.  Residual disease includes 60-70% distal  LAD, 95% mid first obtuse marginal, 25% RCA, PDA 25%.  EF 70% with  normal wall motion.  He also has a history of syncope with pausing  greater than 10 seconds and has had a Medtronic Signa dual chamber pacer  inserted in January of 2006.  Hypertension.  Type 2 diabetes.  Pleural  effusions with post thoracentesis.  Moderate pulmonary hypertension.  Aspiration pneumonia.  Macular degeneration with legal blindness.  Hyperlipidemia.  Peripheral vascular disease with intermittent lower  extremity claudication.  Bilateral femoral bruits.  Normal ABI in  September of 2007.   PHYSICAL EXAM:  GENERAL:  Well-developed, well-nourished pleasant white  male in no acute distress.  Blood pressure is 135/69, heart rate 63 and regular, weight  194, pulse  ox 94%.  HEENT:  Grossly unremarkable.  He does have some JVD approximately 6 to  8 cm at 45 degrees.  LUNGS:  Diminished at the bases with occasional dry crackles.  HEART:  PMI is not displaced.  Regular rate and rhythm.  Normal S1, S2.  I do not appreciate murmurs, rubs, clicks, or gallops.  ABDOMEN:  Unremarkable.  LOWER EXTREMITIES:  2+ bilateral pitting edema to his upper ankles.  Peripheral pulses are intact.   IMPRESSION AND PLAN:  Continuing lower extremity edema without evidence  of respiratory compromise.  On review of his medications with Dr.  Andee Lineman, we will increase his Bumex to 2 mg in the morning and continue 1  mg in the evening.  We will see him next week.  On the day prior to his  appointment, we will obtain a BMET to reassess his kidney function and  potassium, given the increase in the diuretics, as well as check a BNP  and PA and lateral chest x-ray.  He may need further adjustments of his  medications.  I have given Mrs. Crocker a  copy of his recent lipids and  LFTs and have asked him to continue his current medications.  I have  encouraged Mr. Brosh to avoid sleeping during the day in the hopes that  this will assist with his sleeping ability in the evening.      [name] Rued, PA-C  Electronically Signed      Learta Codding, MD,FACC  Electronically Signed   EW/MedQ  DD: 06/01/2007  DT: 06/01/2007  Job #: [id number]   cc:   Doreen Beam

## 2011-05-13 NOTE — Assessment & Plan Note (Signed)
[facility]                          [facility]   NAME:[name], [name]                        MRN:          [id number]  DATE:03/22/2009                            DOB:          Apr 23, 1921    REFERRING PHYSICIAN:  Dr. Sherril Croon   HISTORY OF PRESENT ILLNESS:  The patient is an 77 year old male with a  history of bradycardia status post pacemaker implantation.  The patient  also has coronary artery disease and is status post bare-metal stent to  the circumflex, August 01, 2007.  He has normal LV function.  His blood  pressure has been under better control.  His wife now also gives the  patient bumetanide every other day.  The patient has complained about  weakness as well as myalgias.  Once his Zocor was stopped; however, his  symptoms resolved.  He also has frequent heartburn, pretty much every  night for which he uses Maalox.  He is noted on his blood work to be  anemic with a hemoglobin of 10.9 which has been a progressive decline in  his blood work.   MEDICATIONS:  1. Amlodipine 5 mg a day.  2. Benazepril 20 mg p.o. daily.  3. Actos 5 mg p.o. daily.  4. Ultram 50 mg p.o. b.i.d.  5. Potassium 20 mEq.  6. Ocuvite b.i.d.  7. Doxazosin 8 mg half tab p.o. daily.  8. Lyrica 75 mg 2 tablets p.o. nightly.  9. Magnesium.  10.Aricept.  11.Citracal.  12.Cinnamon.  13.Multivitamin.  14.Calcium.  15.Vitamin C.  16.Aspirin 325 mg p.o. daily.  17.Omega-3 fish oil.  18.Bumetanide 1 mg every other day.   PHYSICAL EXAMINATION:  VITAL SIGNS:  Blood pressure is 155/75, heart  rate 71, weight is 193 pounds.  NECK:  Normal carotid stroke and no carotid bruits.  LUNGS:  Clear breath sounds bilaterally.  HEART:  Regular rate and rhythm.  Normal S1, S2.  No murmurs, rubs, or  gallops.  ABDOMEN:  Soft, nontender.  No rebound or guarding.  Good bowel sounds.  EXTREMITIES:  No cyanosis, clubbing, or edema.   PROBLEM LIST:  1. Myalgias resolved  secondary to statin.  2. Coronary artery disease, stable.      a.     Status post bare-metal stent circumflex, August 01, 2007.      b.     Residual coronary artery disease March 29, 2007.      c.     Normal left ventricular function.  3. Sinus node dysfunction and syncope status post pacemaker      implantation in 2006.  4. Hypertension.  5. Diabetes mellitus.  6. Macular degeneration, legally blind.  7. Peripheral vascular disease.  8. Chronic renal insufficiency, stable.  9. Sleep apnea using CPAP.  10.Anemia, etiology unclear.   PLAN:  1. From a cardiac standpoint, the patient is doing well.  He reports      no chest pain or increased shortness of breath.  I do not think the      patient needs any ischemia testing at this point in time given his  asymptomatic status.  2. The patient does have worsening anemia and we will order iron      studies as well as vitamin B12 and RBC folate.  3. It is possible that the patient's progressive anemia is related to      his renal disease.  Further followup can be provided by Dr. Sherril Croon.     Learta Codding, MD,FACC  Electronically Signed    GED/MedQ  DD: 03/22/2009  DT: 03/23/2009  Job #: [id number]   cc:   Doreen Beam, MD

## 2011-05-13 NOTE — Assessment & Plan Note (Signed)
[facility]                          [facility]   NAME:[name] [name] BLEDSOE                        MRN:          [id number]  DATE:10/03/2008                            DOB:          [birth year]    REFERRING PHYSICIAN:  Doreen Beam, MD   REFERRING PHYSICIAN:  Doreen Beam, MD   HISTORY OF PRESENT ILLNESS:  The patient is an 77 year old male with  history of coronary artery disease, sinus node dysfunction status post  pacemaker implantation.  The patient is also legally blind.  He has  reported some mild low grade fevers.  He has been having a runny nose  and also cough over the last couple of days.  He has not been feeling  well with general malaise.  He also has reported shoulder, elbow, and  hip pain.  Saw Dr. Sherril Croon last week and got a cortisone injection to the  left hip without much improvement.  The patient denies having a  substernal chest pain or shortness of breath.   MEDICATIONS:  1. Humulin N.  2. Calcium 100 mg p.o. daily.  3. Vitamin C.  4. Omega 3.  5. Centrum Ag.  6. Citracal.  7. Amlodipine/benazepril 5/20 daily.  8. Doxazosin __________.  9. __________  10.Zocor 20 mg a day.  11.Bumex 1 mg p.o. q. a.m.   PHYSICAL EXAMINATION:  VITAL SIGNS:  Blood pressure is 125/54, heart  rate is 83, weight 99.6 pounds.  Temperature is 99.4.  GENERAL:  Well-nourished __________ male feeling somewhat clammy.  I had  the patient undress in a gown.  I did not see any skin lesions or  rashes.  Lung exam was normal.  Heart exam regular rate and rhythm with  normal S1 and S2.  No murmurs or gallops.  ABDOMEN:  Soft, nontender, no rebound, or guarding, good bowel sounds.  MUSCULOSKELETAL:  The patient has good range of motion in the shoulders  and the hip and knee joints without any pain or restricted movement.   Electrocardiogram, normal sinus rhythm with no acute ischemic change.   PROBLEMS:  1. Febrile illness rule out influenza.  2.  Coronary artery disease.      a.     Status post bare metal stent in the circumflex August 01, 2007.      b.     Residual coronary artery disease, March 29, 2007.      c.     Normal left ventricular function.  3. Sinus node dysfunction and syncope, status post pacemaker      implantation 2006.  4. Hypertension.  5. Diabetes mellitus.  6. History of pleural effusion.  7. Moderate pulmonary hypertension.  8. History of aspiration pneumonia.  9. Macular degeneration, legally blind.  10.Peripheral vascular disease.  11.Chronic renal insufficiency.  12.Sleep apnea.   PLAN:  1. At this point, the patient did not have any high-grade fevers and I      doubt that he has endocarditis or pacemaker infection.  I did not      draw blood cultures  today.  2. His clinical presentations are consistent with a flu-like syndrome      and given his comorbidities and his frail status, I have treated      him with Tamiflu 75 mg p.o. b.i.d. x5 days.  3. I have asked the patient's wife to give me a call by Friday to see      how the patient is doing.  If he did not have any improvement and      he has more fevers __________ should draw blood cultures and      further evaluation for his myalgias and arthralgias may be in      order.  __________ told me that over the last several days he has      noticed in the morning some shakes in both hands, which could be an      intention tremor, but also could be a sign of early parkinsonian      disease.  I have asked her to further discuss this with Dr. Sherril Croon.     Learta Codding, MD,FACC  Electronically Signed    GED/MedQ  DD: 10/03/2008  DT: 10/04/2008  Job #: [id number]   cc:   Doreen Beam, MD

## 2011-05-13 NOTE — Cardiovascular Report (Signed)
[facility]                   [facility]   NAME:[name] [name], [name] [name]                        MRN:          [id number]  DATE:01/19/2009                            DOB:          12-09-1921    Mr. Kho was seen following pacemaker implantation for syncope in the  setting of her sinus bradycardia.  This was about 4 years ago by Dr.  Lewayne Bunting, he has had no recurrent syncope.   His wife's major concern is that his blood pressure is elevated.  She  brings in 2 months of recordings.  She notes that she has tried to  adjust his Bumex at home and has noted that since the Bumex was  discontinued while he has had no edema, his blood pressures have gone  up.  There has also been some issues of dietary indiscretion.   She also notes that he has problems with soreness in his legs,  particularly in the hip curls, nonspecifically in the shoulders.  She  reports that having going to the [place] that his statins were recently  changed.   CURRENT MEDICATIONS:  1. Simvastatin 40.  2. Amlodipine 5.  3. Benazepril 20.  4. Bumex taken on a p.r.n. basis, currently not.  5. Actos 15.  6. Doxazosin 4.  7. Magnesium and Aricept 10.   PHYSICAL EXAMINATION:  VITAL SIGNS:  His blood pressure today is 172/74  and his pulse is 68.  NECK:  Veins are flat.  LUNGS:  Clear.  HEART:  Sounds are regular with an early systolic murmur, but it  probably with S2.  ABDOMEN:  Soft.  EXTREMITIES:  No edema.   Interrogation of Medtronic single pulse generator demonstrates a P wave  of 2.8 with impedance of 459 with threshold of 1 volt at 0.4 in both RA  and RV.  The amplitude was 11.2 with impedance of 445.  Battery voltage  is 2.8.  He is atrially paced about 9%, at the time ventricularly paced  not at all.  The longest mode switch episode is 4 minutes.   IMPRESSION:  1. Syncope with pauses.  2. Status post pacer for the above.  3  Hypertension, question  worsening with dietary versus removal of  diuretic.  1. Proximal muscle soreness particularly in the lower extremities.   We will plan to leave his pacemaker programmed as is.  I have asked them  to resume his Bumex, take it once a day in the morning.  As relates to  his soreness in his legs, I have suggested they should stop his statin.  In the event that his symptoms persist for a month, afterward I have  asked that they will follow up with Dr. Sherril Croon to look at the possibility  of polymyalgia rheumatica.  We will see him again in one year's time.      Duke Salvia, MD, [facility]  Electronically Signed    SCK/MedQ  DD: 01/19/2009  DT: 01/20/2009  Job #: [id number]   cc:   Doreen Beam, MD

## 2011-05-16 NOTE — Assessment & Plan Note (Signed)
[facility]                          [facility]   NAME:[name] [name] [name]                        MRN:          [id number]  DATE:04/20/2007                            DOB:          Apr 03, 1921    [name] [name] returns today for followup status post recent hospitalization  with cardiac catheterization/PTCA and stenting of the mid left  circumflex and star close of the right femoral artery by Dr. Tonny Bollman on April 01, 2007.  [name] [name] states that he has been doing well  since he was discharged home.  He is accompanied by his wife today, who  denies any problems.  The patient has been tolerating his Plavix and  aspirin without any complications.  No side effects from his initiated  Statin during his recent hospitalization.  Denies any fevers or chills,  or any problems from catheterization site.  No episodes of chest  discomfort.  He states he saw Dr. Sherril Croon since discharge from the hospital  and will follow up with him again in 3 months.  [name] [name] complains of  ongoing lower extremity edema, which is managed by Dr. Sherril Croon.  He is  currently on 2 mg of Bumex b.i.d.  He states he cannot tell any  difference in the fluid status.  Denies any fluctuations in his weight,  orthopnea, PND, dyspnea.   PAST MEDICAL HISTORY:  1. Coronary artery disease status post recent hospitalization for      chest pain.  Cardiac catheterization at that time showing severe      single-vessel coronary artery disease to the circumflex.  Underwent      PTCA and stenting of the mid left circumflex.  The patient also      with residual disease.  The LAD distally had a 60-70% stenosis, mid      large obtuse marginal 95% stenosis, right coronary artery with      diffuse 25% lesions, PDA with diffuse 25% stenosis.  Ejection      fraction calculated at 70% with normal wall motion.  2. History of sinus node dysfunction with associated syncope/pauses      greater than 10 seconds.   Status post implantation of a Medtronic      Sigma dual-chamber pacemaker in January of 2006.  3. Hypertension.  4. Type 2 diabetes.  5. History of left pleural effusion status post thoracentesis in      February of 2007.  6. Moderate pulmonary hypertension.  7. History of aspiration pneumonia.  8. Macular degeneration/legally blind.  9. Hypercholesterolemia.  10.History of peripheral vascular disease.      a.     Bilateral lower extremity intermittent claudication.      b.     Bilateral femoral bruits.      c.     Normal lower extremity ankle brachial indexes in September       of 2007.   REVIEW OF SYSTEMS:  As stated above.   MEDICATIONS:  1. Aspirin 325.  2. Plavix 75 mg daily x30 days.  3. Amlodipine/benazepril 5/20 mg daily.  4. Bumex  2 mg b.i.d.  5. Cardura 4 mg daily.  6. Actos 15 daily.  7. Lyrica 75 nightly.  8. Humulin N 20 units q. a.m. and 12 units p.m.  9. Citrucel 1 teaspoon daily.  10.Ocuvite 2 mg daily.  11.Multivitamin daily.  12.Zocor 40 mg daily.   PHYSICAL EXAM:  Blood pressure 139/65, heart rate 65, weight 190 pounds.  [name] [name] is alert and oriented.  No acute distress.  Very pleasant 77-  year-old Caucasian gentleman.  JVD, he has noted 8 cm at a 45 degree angle.  LUNGS:  Clear to auscultation bilaterally.  CARDIOVASCULAR:  Reveals an S1 and S2, regular rate and rhythm.  ABDOMEN:  Soft and nontender.  Positive bowel sounds.  LOWER EXTREMITIES:  The patient has 2+ pitting edema bilaterally.  Calf  height to right groin, stable.  No hematoma.  Previous femoral bruits  noted.   IMPRESSION:  Stable post cardiac catheterization/stenting of the left  circumflex.  The patient completing 30 days of Plavix therapy.  Will  continue aspirin therapy indefinitely.  Will arrange for the patient to  have LFT and lipids drawn within the next 2 to 3 weeks secondary to  initiation of Statin therapy.  Will continue other medications as  previously prescribed.  I am  concerned that the patient is maintaining  some mild volume overload.  However, he denies any symptoms of  respiratory compromise.  We will see the patient back in 6 weeks for  followup.  He may need to have adjustments in his diuretic therapy.      Dorian Pod, ACNP  Electronically Signed      Learta Codding, MD,FACC  Electronically Signed   MB/MedQ  DD: 04/20/2007  DT: 04/20/2007  Job #: [id number]   cc:   Doreen Beam

## 2011-05-16 NOTE — Assessment & Plan Note (Signed)
[facility]                         ELECTROPHYSIOLOGY OFFICE NOTE   NAME:Dacosta, CELSO GRANJA                        MRN:          [id number]  DATE:12/14/2006                            DOB:          Mar 03, 1921    Mr. Besse was seen in the device clinic today, December 14, 2006, for  routine followup of his Medtronic Sigma [id number] dual-chamber pacemaker  implanted January 2006 for bradycardia with pauses.   Upon interrogation, battery voltage is 2.8 volts. In the atrium,  intrinsic amplitude is greater than 2.8 millivolts with an impedance of  441 ohms and threshold of 1 volt at 0.4 msec. In the right ventricle,  intrinsic amplitude is greater than 11.2 millivolts with an impedance of  463 ohms and threshold of 0.75 volts at 0.4 msec. His paced AV delay was  increased from 250 to 280 to allow intrinsic induction. He had  previously been pacing about 20% of the time in the ventricle but I  believe we can alleviate that. No other changes were made. He will be  seen in six months' time for followup in clinic.      Cleatrice Burke, RN  Electronically Signed      Duke Salvia, MD, [facility]  Electronically Signed   CF/MedQ  DD: 12/14/2006  DT: 12/14/2006  Job #: [id number]

## 2011-05-16 NOTE — Discharge Summary (Signed)
[name] [name], [name] [name]                 ACCOUNT NO.:  [id number]   MEDICAL RECORD NO.:  [id number]          PATIENT TYPE:  INP   LOCATION:  6526                         FACILITY:  [facility]   PHYSICIAN:  Bettey Mare. [place], NPDATE OF BIRTH:  1921-10-10   DATE OF ADMISSION:  04/01/2007  DATE OF DISCHARGE:  04/02/2007                               DISCHARGE SUMMARY   PRIMARY CARDIOLOGIST:  Learta Codding, MD, [facility].   PRIMARY CARE PHYSICIAN:  Doreen Beam, MD.   PROCEDURES PERFORMED DURING HOSPITALIZATION:  Cardiac catheterization.  a)  Percutaneous transluminal coronary angioplasty, stenting of the mid-  left circumflex, and [employer] of the right femoral artery.   PRIMARY DIAGNOSES:  1. Coronary artery disease.      a.     Status post cardiac catheterization on March 29, 2007, by       Dr. Andee Lineman revealing severe single-vessel coronary artery disease.       Left anterior descending had proximal calcification, left main had       25% stenosis.  There was diffuse proximal 25% stenosis in the left       anterior descending.  There was a long mid 40% calcified region.       There was a distal 60% to 70% stenosis.  The first diagonal was       small and normal, the second diagonal was large and bifurcating       with ostial 25% stenosis and mid 40% stenosis.  The circumflex in       the AV groove had diffuse luminal irregularities.  There was       proximal 25% stenosis.  There was a mid large obtuse marginal       which had 95% stenosis.  The ramus intermediate was large with       diffuse luminal irregularities.  The right coronary was dominate.       There were diffuse 25% lesions.  The posterior descending artery       was large with diffuse 25% stenosis.  Left ventriculogram revealed       an ejection fraction of 70% with normal wall motion.  2. Status post stenting of the mid to left circumflex.  3. Status post Medtronic Sigma dual chamber pacemaker, January of      2006.      a.      Sinus node dysfunction with associated syncope/pauses       greater than 10 seconds.      b.     Status post interrogation at [facility] in November of 2007       with normal functioning parameters.  4. Hypertension.  5. Type 2 diabetes.  6. History of left pleural effusion.      a.     Status post thoracentesis in February of 2007.  7. Normal left ventricular function.  8. Ejection fraction 65% by 2D echo in February of 2007.  9. Moderate pulmonary hypertension.  10.History of aspiration pneumonia.  11.Macular degeneration.      a.     Legally blind.  HISTORY OF PRESENT ILLNESS:  This is a 77 year old Caucasian male  patient of Dr. Lewayne Bunting in [place], [place], with a history of  severe single-vessel coronary artery disease per cardiac catheterization  in March of 2008.  The patient was admitted secondary to this lesion  with follow-up cardiac catheterization for intervention.   The patient had complaints of new onset low-level chest pain consistent  with unstable angina and, secondary to this, high grade circumflex  disease, and moderate left anterior disease, the patient was referred  for PTI of the left circumflex and was seen by Dr. Tonny Bollman.   The patient did well post procedure without evidence of hematoma,  bleeding, bruit, no recurrence of chest pain.  The patient was seen and  examined by myself and Dr. Tonny Bollman on day of discharge.  The  patient was to continue his Plavix, and additional medication of Zocor  and nitroglycerin was provided.  The patient was given post-cardiac  catheterization instructions and found stable for discharge.   DISCHARGE LABS:  Hemoglobin 10.5, hematocrit 33.9, white blood cells 7,  platelets 220.  Sodium 131, potassium 4.3, chloride 101, CO2 24, BUN 18,  creatinine 1.2, glucose 178.  Telemetry revealing sinus rhythm, sinus  brady, ventricular rate in the 60s.   DISCHARGE MEDICATIONS:  1. Aspirin 325 one p.o. every  day.  2. Plavix 75 mg p.o. every day.  3. Amlodipine/benazepril 5/20 mg daily.  4. Bumex 2 mg twice a day.  5. Cardura 4 mg daily.  6. Actos 50 mg daily.  7. Lyrica 75 mg at bedtime.  8. Humulin N insulin 20 units subcutaneous a.m. 12 units subcutaneous      p.m.  9. Citracal 1 teaspoon p.o. every day.  10.Ocuvite 2 mg daily.  11.Centrum Silver daily.  12.Zocor 40 daily.  13.Nitroglycerin 0.4 mg p.r.n.   ALLERGIES:  NO KNOWN DRUG ALLERGIES.   FOLLOW-UP PLANS AND APPOINTMENTS:  1. The patient will be followed by Dr. Andee Lineman on April 21 at 2:30 p.m.      for post-intervention check and followup of cardiac treatment.  2. The patient has been given post cardiac catheterization      instructions with emphasis on the right groin site for evidence of      bleeding, hematoma, or infection, and she is to call the office      immediately.  3. The patient is to follow up with his primary care physician, Dr.      Sherril Croon, when he returns home.  He is to make that appointment.   TIME SPENT WITH PATIENT - TO INCLUDE PHYSICIAN TIME:  35 minutes.      Bettey Mare. Lyman Bishop, NP     KML/MEDQ  D:  04/02/2007  T:  04/03/2007  Job:  [id number]

## 2011-05-16 NOTE — H&P (Signed)
[name] [name], [name] [name]                 ACCOUNT NO.:  [id number]   MEDICAL RECORD NO.:  [id number]          PATIENT TYPE:  INP   LOCATION:  6526                         FACILITY:  [facility]   PHYSICIAN:  Veverly Fells. Excell Seltzer, MD  DATE OF BIRTH:  Dec 13, 1921   DATE OF ADMISSION:  04/01/2007  DATE OF DISCHARGE:  04/02/2007                              HISTORY & PHYSICAL   PRIMARY CARE PHYSICIAN:  Dr. Sherril Croon.   PRIMARY CARDIOLOGIST:  Dr. Learta Codding.   HISTORY OF PRESENT ILLNESS:  This is a 77 year old Caucasian male with a  history of known coronary artery disease who is seen by Dr. Lewayne Bunting  in [place], [place].  Patient has severe single-vessel coronary  artery disease per cardiac catheterization in March of 2008.  The  patient was admitted secondary to this lesion with followup cardiac  catheterization and/or intervention.  The patient had had a history of  new onset low-level chest pain consistent with unstable angina and  secondary to this the patient was found to have high-grade circumflex  disease and moderate left anterior artery disease.  The patient was  referred for the PCI of the left circumflex and was seen by Dr. Excell Seltzer  on admission.   PAST MEDICAL HISTORY:  Includes:  1. Coronary artery disease.      a.     Status post cardiac catheterization, March 29, 2007,       revealing severe single-vessel coronary artery disease.  The left       anterior descending and proximal calcification.  The left main had       25% stenosis.  There was diffuse proximal 25% stenosis in the left       anterior descending.  There was a long mid 40% calcification       recalcified region.  There was a distal 60-70% stenosis.  The       first diagonal was small and normal.  The second diagonal was       large and bifurcating with ostial 25% stenosis and a mid 40%       stenosis.  The circumflex in the AV grove had diffuse luminal       irregularities.  There was a proximal 25% stenosis.  There was  a       mid large obtuse marginal, which had 95% stenosis.  The ramus       intermedius was large with diffuse luminal irregularities.  The       right coronary artery was dominant and there was diffuse 25%       lesions.  The posterior descending artery was enlarged with       diffuse 25% stenosis.  The left ventriculogram revealed an       ejection fraction of 70% with normal wall motion.  2. Status post stenting of the mid and left circumflex.  3. Status post Medtronic Sigma dual-chamber pacemaker, January of      2006.      a.     Sinus node dysfunction with associated syncope, pauses  greater than 10 seconds.      b.     Status post interrogation in the [facility], November of       2007, with normal function and parameters.  4. Hypertension.  5. Type 2 diabetes.  6. History of left pleural effusion.      a.     Status post thoracentesis, February of 2007.  7. Normal left ventricular ejection fraction of 65% on echo in      February of 2007.  8. Pulmonary hypertension, moderate.  9. History of aspiration pneumonia.  10.Macular degeneration.      a.     Legally blind.   CURRENT LABS:  Hemoglobin 10.5, hematocrit 33.9, white blood cell is  7.0, platelets 220.  Sodium 131, potassium 4.3, chloride 101, CO2 24,  BUN 18, creatinine 1.2, glucose 178.  EKG revealing normal sinus rhythm,  sinus brady, ventricular rate in the 60s.   CURRENT MEDICATIONS:  1. Aspirin 325 one p.o. daily.  2. Plavix 75 mg p.o. daily.  3. Amlodipine/benazepril 5/20 mg daily.  4. Bumex 2 mg twice a day.  5. Cardura 4 mg daily.  6. Actos 50 mg daily.  7. Lyrica 75 mg at bedtime.  8. Humulin and insulin 20 units subcutaneously in the a.m., 12 units      subcutaneously in the p.m.  9. Citrucel 1 teaspoon p.o. every day.  10.Ocuvite 2 mg daily.  11.Centrum Silver daily.  12.Zocor 40 mg daily.  13.Nitroglycerin 0.4 mg p.r.n.   ALLERGIES:  NO KNOWN DRUG ALLERGIES.   SOCIAL HISTORY:  The patient  lives in [place]  [place].  He has 1 grown child.  He denies any history of tobacco or  alcohol use.  He is a retired Merchandiser, retail.   FAMILY HISTORY:  Father deceased with history of congestive heart  failure.   REVIEW OF SYSTEMS:  The patient had ongoing chest discomfort on and off  while at rest without complaints of exertional dyspnea, orthopnea or  paroxysmal nocturnal dyspnea over the last few weeks.  He had also  occasional heartburn symptoms; otherwise, the patient has been without  complaints.  Review of other systems found to be negative.   PHYSICAL EXAMINATION:  GENERAL:  He is awake, alert and oriented in no  acute distress.  HEENT:  Head is normocephalic, atraumatic.  Eyes:  PERRLA.  Mucous  membranes in mouth are pink and moist.  Tongue is midline.  NECK:  Supple without JVD or carotid bruits appreciated.  CARDIOVASCULAR:  Regular rate and rhythm without murmurs, rubs or  gallops.  LUNGS:  Clear to auscultation.  ABDOMEN:  Soft, nontender with 2+ bowel sounds.  EXTREMITIES:  Without clubbing, cyanosis or edema.  VITAL SIGNS:  Blood pressure 139/65.  Heart rate 65.  Respirations 20.  He weighed 190 pounds.   PLAN:  The patient will be admitted for percutaneous coronary  intervention of the circumflex at the request of Dr. Excell Seltzer based upon  patient's response to treatment in medications.      Bettey Mare. Lyman Bishop, NP      Veverly Fells. Excell Seltzer, MD  Electronically Signed    KML/MEDQ  D:  05/13/2007  T:  05/13/2007  Job:  [id number]   cc:   Doreen Beam

## 2011-05-16 NOTE — Op Note (Signed)
[name] [name], [name] [name]                 ACCOUNT NO.:  [id number]   MEDICAL RECORD NO.:  [id number]          PATIENT TYPE:  INP   LOCATION:  2929                         FACILITY:  [facility]   PHYSICIAN:  [name]. [name], M.D.  DATE OF BIRTH:  [birth year]   DATE OF PROCEDURE:  DATE OF DISCHARGE:                                 OPERATIVE REPORT   DATE OF PROCEDURE:  January 17, 2005.   PROCEDURE PERFORMED:  Implantation of a dual-chamber pacemaker.   INDICATION:  Symptomatic bradycardia with pauses of greater than 10 seconds.   I. INTRODUCTION:  The patient is a pleasant, 77 year old male with no  significant cardiac history.  He has a history of hypertension and diabetes.  He was admitted to the hospital and was found to have pauses of over 10  seconds on recurrent episodes associated with asystole.  The patient is now  referred for permanent pacemaker implantation.   II. PROCEDURE:  After informed consent was obtained, the patient was taken  to the diagnostic EP lab in the fasting state.  After the usual preparation  and draping, intravenous Fentanyl and Midazolam were given for sedation.  30  mL of lidocaine were infiltrated into the left infraclavicular region.  A 5-  cm incision was carried out over this region, and electrocautery utilized to  dissect down to the fascial plane.  10 mL of contrast were injected into the  left __________venous system demonstrating a patent left subclavian vein.  It was subsequently punctured x2, and a Medtronic Model 5076, 58 cm, active  fixation pacing lead, serial #[id number], was advanced into the right  ventricle.  The Medtronic Model 5076, 52 cm, active fixation pacing lead,  serial #[id number], was advanced into the right atrium.  Mapping was carried  out in the right ventricle into the final site.  The R-waves measured 9-mV,  and the pacing threshold was 0.5-V at 0.5-msec with the lead actively fixed.  The pacing impedance was 947 ohms.  10-V pacing  did not stimulate the  diaphragm.  With the ventricular lead in satisfactory position, attention  was then turned to the atrial lead.  It was placed in the right atrial  appendage where P-waves measured 5-mV and the atrial threshold 0.4-V at 0.5-  msec.  Pacing impedance was 597-ohms.  Again, 10-V pacing did not stimulate  the diaphragm.  With both atrial and ventricular leads in satisfactory  position, they were secured to the subpectoralis fascia with a figure-of-  eight silk suture.  The sewing sleeve was secured with a silk suture.  Electrocautery was utilized to make a subcutaneous pocket.  Kanamycin  irrigation was utilized to irrigate the pocket.  Electrocautery was utilized  to assure hemostasis.  The Medtronic Sigma dual-chamber pacemaker, serial  [id number], was then connected to the atrial and ventricular pacing leads  and placed in the subcutaneous pocket.  The generator was secured with a  silk suture.  Additional Kanamycin irrigation was utilized to irrigate the  pocket, and electrocautery utilized to assure hemostasis.  The incision was  then closed with a layer of  2-0 Vicryl, followed by a layer of 3-0 Vicryl,  followed by a layer of 4-0 Vicryl.  Benzoin was painted on the skin, Steri-  Strips were applied, a pressure dressing placed, and the patient returned to  his room in satisfactory condition.   III. COMPLICATIONS:  There were no immediate procedural complications.   IV. RESULTS:  This demonstrates successful implantation of a Medtronic dual-  chamber pacemaker in a patient with symptomatic bradycardia.       GWT/MEDQ  D:  01/17/2005  T:  01/18/2005  Job:  [id number]   cc:   Wende Crease, M.D.   [facility]

## 2011-05-16 NOTE — Cardiovascular Report (Signed)
NAMECORTLIN, MARANO                 ACCOUNT NO.:  [id number]   MEDICAL RECORD NO.:  [id number]          PATIENT TYPE:  OIB   LOCATION:  1963                         FACILITY:  [facility]   PHYSICIAN:  Rollene Rotunda, MD, FACCDATE OF BIRTH:  12/14/1921   DATE OF PROCEDURE:  03/29/2007  DATE OF DISCHARGE:                            CARDIAC CATHETERIZATION   PRIMARY CARE PHYSICIAN:  Doreen Beam, M.D.   CARDIOLOGIST:  Learta Codding, M.D., [facility].   PROCEDURE:  Left heart catheterization/coronary arteriography.   INDICATIONS:  Evaluate patient with unstable angina.   PROCEDURE NOTE:  Left heart catheterization was performed via the right  femoral artery.  The artery was cannulated using anterior wall puncture.  A #4-French arterial sheath was inserted via the modified Seldinger  technique.  Preformed Judkins and pigtail catheter were utilized.  The  patient tolerated the procedure well and left the lab in stable  condition.   RESULTS:  Hemodynamics:  LV 140/14, AO 142/94.   Coronaries:  The left main had ostial 25% stenosis.  The LAD had  proximal calcification.  There was diffuse proximal 25% stenosis. There  was a long mid 40% calcified region.  There was a distal 60-70%  stenosis.  The first diagonal was small and normal.  The second diagonal  was large and bifurcating with ostial 25% stenosis and mid 40% stenosis.  The circumflex in the AV groove had diffuse luminal irregularities.  There was proximal 25% stenosis.  There was a mid large obtuse marginal  which had mid 95% stenosis.  The ramus intermediate was large with  diffuse luminal irregularities.  The right coronary artery was dominant.  There were diffuse 25% lesions.  The PDA was large with diffuse 25%  stenosis.   Left ventriculogram:  Left ventriculogram was obtained in the RAO  projection.  The EF was 70% with normal wall motion.   CONCLUSION:  Severe single-vessel coronary disease.   PLAN:  The patient will have elective  percutaneous revascularization of  the most significant lesion which is the circumflex lesion.  This is  most likely a culprit vessel for his unstable angina.      Rollene Rotunda, MD, [facility]  Electronically Signed     JH/MEDQ  D:  03/29/2007  T:  03/29/2007  Job:  [id number]   cc:   Bea Laura, MD,FACC

## 2011-05-16 NOTE — H&P (Signed)
[name], [name]                 ACCOUNT NO.:  [id number]   MEDICAL RECORD NO.:  [id number]          PATIENT TYPE:  INP   LOCATION:  2023                         FACILITY:  [facility]   PHYSICIAN:  [name]. [name], M.D.  DATE OF BIRTH:  01-12-1921   DATE OF ADMISSION:  01/17/2005  DATE OF DISCHARGE:                                HISTORY & PHYSICAL   REASON FOR ADMISSION:  [name] [name] is an 77 year old male with no prior cardiac  history and cardiac risk factors notable for hypertension and type 2  diabetes mellitus, who was transferred from [facility] Emergency Room after  presenting with recurrent syncope and documented pauses of greater than 10  seconds.   The patient by report was sitting at the breakfast table on the morning of  admission.  He suddenly developed nausea and associated vomiting and fell  over onto the floor.  He was assisted by his wife, placed back in his chair  and then had a second episode of syncope, but did not fall this time.  He  was subsequently transported to the emergency room where he had his third  episode of syncope.  Following stabilization, arrangements were made for  transfer for further evaluation and placement of a permanent pacemaker.   ALLERGIES:  No known drug allergies.   MEDICATIONS PRIOR TO ADMISSION:  1.  Lotrel 5/20 mg one tablet b.i.d.  2.  Avandia two daily.  3.  Humalog 70/30 16 units b.i.d.  4.  Doxazosin 4 mg daily.  5.  Bumex 10 mg daily.  6.  Hyoscyamine 0.125 mg b.i.d.  7.  Nortriptyline 25 mg q.h.s. p.r.n.   PAST MEDICAL HISTORY:  1.  Hypertension.  2.  Diabetes mellitus.  3.  Status post cholecystectomy.  4.  Status post appendectomy.  5.  Gastrointestinal reflux disease/erosive peptic ulcer disease.  6.  Recent pinch nerve at the cervical neck secondary to severe stenosis.   SOCIAL HISTORY:  The patient lives in [place], [place], with his wife.  He has one grown child.  He denies any history of tobacco or alcohol  abuse.  He is a retired Merchandiser, retail.   FAMILY HISTORY:  Father deceased with a history of congestive heart failure.   REVIEW OF SYSTEMS:  Denies any history of chest discomfort, exertional  dyspnea, orthopnea and paroxysmal nocturnal dyspnea over the past few weeks.  Has occasionally heartburn symptoms.  He had no recent upper or lower GI  bleeding.  The patient denies any prior history of myocardial infarction,  congestive heart failure, syncope, stroke or thyroid disease.  Remaining  systems negative.   PHYSICAL EXAMINATION:  VITAL SIGNS:  Blood pressure 150/48, pulse 104,  respirations 26.  GENERAL APPEARANCE:  An 77 year old male in no apparent distress.  HEENT:  Normocephalic and atraumatic.  NECK:  Bilateral carotid pulses.  Bilateral supraclavicular bruits.  HEART:  Regular rate and rhythm.  Positive S1 and S2.  Positive S4.  A 3/6  harsh crescendo-decrescendo murmur in the upper __________.  LUNGS:  Clear to auscultation in all fields.  ABDOMEN:  Soft and  nontender with intact bowel sounds.  EXTREMITIES:  Preserved bilateral femoral pulses with soft left femoral  bruit.  Intact right posterior tibialis pulse with nonpalpable left  posterior tibialis pulse.  Trace pedal edema.  NEUROLOGIC:  No focal deficits.   LABORATORY DATA:  Chest x-ray:  Mild cardiomegaly; no acute disease.   Electrocardiogram:  Normal sinus rhythm __________ with normal axis and  occasional PACs.  No ischemic changes.   Hemoglobin 11.1, MCV 97, hematocrit 32, WBC 9.0, platelets 203.  Sodium 126,  potassium 4.3, BUN 15, creatinine 1.1, glucose 151.  CPK 160/6.7, troponin I  0.03.   IMPRESSION:  1.  Recurrent syncope.      1.  Documented sinus arrest with pauses greater than 10 seconds.  2.  Systolic murmur.  3.  Exertional dyspnea.  4.  Type 2 diabetes mellitus.  5.  Hypertension.   PLAN:  The patient will be admitted to the intensive care unit and will be  referred to [name]. [name], M.D., for  permanent pacemaker implantation.  Will also check a 2-D echocardiogram for assessment of left ventricular  function and systolic murmur.  In addition to routine laboratories, will  also check an iron profile and ferritin level for further evaluation of  anemia and guaiac stools.  Additional laboratories will consistent of a  fasting lipid profile, TSH and hemoglobin A1C level.     ______________________________  Rozell Searing, P.A. [facility]                      ______________________________  [name]. [name], M.D.    GS/MEDQ  D:  01/20/2005  T:  01/20/2005  Job:  [id number]   cc:   Wende Crease, M.D.  [place], [place]

## 2011-05-16 NOTE — Cardiovascular Report (Signed)
NAME:  [name] [name], [name] [name]                 ACCOUNT NO.:  [id number]   MEDICAL RECORD NO.:  [id number]          PATIENT TYPE:  OIB   LOCATION:  6526                         FACILITY:  [facility]   PHYSICIAN:  Veverly Fells. Excell Seltzer, MD  DATE OF BIRTH:  1921-04-07   DATE OF PROCEDURE:  DATE OF DISCHARGE:                            CARDIAC CATHETERIZATION   PROCEDURE:  PTCA and stenting of the mid left circumflex and Star close  of the right femoral artery.   INDICATIONS:  [name] [name] is a 77 year old gentleman with no prior history  of coronary artery disease, who recently underwent diagnostic  catheterization after presenting with new onset low level chest pain  consistent with unstable angina.  He was found to have high-grade  circumflex disease and moderate LAD disease.  He was ultimately referred  for PCI the left circumflex.  He has been pre-loaded on clopidogrel.   Risks and indications of procedure were explained to the patient.  Informed consent was obtained.  The right groin was prepped, draped and  anesthetized with 1% lidocaine.  Using the modified Seldinger technique,  a 6-French sheath was placed in the right femoral artery with a front  wall puncture.  An XB 3.5-mm guide catheter was inserted.  Angiomax was  used for anticoagulation.  Once a therapeutic ACT was achieved, a cougar  guidewire was used to cross the high-grade stenosis.  A 2.0 x 12-mm  balloon was inserted.  This was inflated to 6 atmospheres.  Following  balloon dilatation, there was continued TIMI III flow in the vessel.  A  2.0 x 12-mm mini Vision stent was deployed at 14 atmospheres.  The stent  appeared well expanded.  Following stenting, there was continued TIMI  III flow, and a 2.25 x 8-mm Quantum Maverick balloon was used to post-  dilate the stent up to 20 atmospheres on two serial inflations.  At the  conclusion of the procedure, there was TIMI III flow throughout the  vessel.  The stent appeared well expanded, and the  stented segment was  widely patent.  The right groin arteriotomy was sealed with a 6-French  Star close device.  The patient tolerated the procedure well with no  complications.   CONCLUSION:  Successful stenting of the mid-left circumflex.   RECOMMENDATIONS:  Of the patient should continue on aspirin and  clopidogrel in combination for 30 days.  He should then continue  lifelong clopidogrel and continued medical therapy for his coronary  artery disease. Of note, further angiographic views were taken of a  moderate mid-LAD lesion, and medical therapy was opted for.  If he  continues to have typical symptoms of exertional angina, consideration  of LAD, PCI could be undertaken.      Veverly Fells. Excell Seltzer, MD  Electronically Signed     MDC/MEDQ  D:  04/01/2007  T:  04/01/2007  Job:  [id number]   cc:   Learta Codding, MD,FACC  Dhruv Sherril Croon

## 2011-05-16 NOTE — Discharge Summary (Signed)
NAMEANTONINO, [name] [name]                 ACCOUNT NO.:  [id number]   MEDICAL RECORD NO.:  [id number]          PATIENT TYPE:  INP   LOCATION:  2023                         FACILITY:  [facility]   PHYSICIAN:  Maple Mirza, P.A. DATE OF BIRTH:  [birth year]   DATE OF ADMISSION:  DATE OF DISCHARGE:  01/20/2005                                 DISCHARGE SUMMARY   DISCHARGE DIAGNOSES:  1.  Admitted with syncopal episodes x3.  2.  Symptomatic bradycardia/sick sinus syndrome.  Sinus arrest with absent      ventricular escape.  3.  Status post implant of Medtronic pacemaker by Dr. Lewayne Bunting.  4.  Proteus mirabilis urinary tract infection sensitive to Cipro. The      patient will go home with a course of antibiotic Cipro.  5.  Left bundle branch block.  6.  Chest x-ray with left lingular and left lower lobe air space disease,      concerning for pneumonia.  Patient had aspirated during syncopal events      on January 20th.  7.  Macular degeneration.   SECONDARY DIAGNOSES:  1.  Hypertension.  2.  Diabetes.  3.  Nerve impingement, right-sided cervical spine causing symptoms of right      upper extremity tingling, weakness and clumsiness.   PROCEDURE:  1.  On January 17, 2005, a 2-D echocardiogram study shows that the ejection      fraction is 55-65%.  No regional wall motion abnormalities in the left      ventricle, moderate chordal systolic anterior motion of the mitral      valve, normal mitral valve leaflet excursion, mild mitral regurgitation.      No evidence of aortic stenosis.  Aortic valve area was 2.85 cm/sq.  2.  Implantation of Medtronic pacemaker by Dr. Lewayne Bunting for      symptomatology bradycardia with greater than 10 second arrest pauses.   DISCHARGE DISPOSITION:  [name] [name] was discharged on day #3 after  implantation of Medtronic pacer. He has been monitored in-house for the  presence of fever or white cell spiking secondary to suspected, witnessed  aspiration at the  time of syncope, prior to this admission on January 20th.  His white cell count on the day of discharge 11.1.  He has been afebrile  over the last 2 days.  He is maintaining sinus rhythm. He does test positive  for urinary tract infection and has been started on Cipro antibiotics for a  total of 7 days.  He is wearing a cervical collar since this helps reduce  symptoms from nerve impingement.  There was some question of anemia as well  and he has had decreased iron stores, and will go home on iron  supplementation.  He will also have follow-up blood counts as an outpatient.  [name] [name] was discharged on the following medications.   Mobility has been discussed with the patient.  He is not to move his left  upper extremity above the shoulder until Tuesday, January 24. He will have  free mobility of the left upper extremity Friday, January  27.  He was asked  not to shower until Friday, January 27.   DISCHARGE MEDICATIONS:  1.  Enteric-coated aspirin 81 mg daily - this is new.  2.  Lotrel 5/20 twice daily.  3.  Doxazosin 4 mg daily.  4.  Nortriptyline 75 mg at bedtime as needed.  5.  Hyoscyamine 0.125 mg twice daily.  6.  Cipro 500 mg twice daily - new.  7.  Ferrous sulfate 325 mg daily - new.  8.  For pain at the pacemaker site, Tylenol 325 mg 1-2 p.o. q.4-6h. as      needed for pain.   DISCHARGE DIET:  Low sodium, low cholesterol.   WOUND CARE:  He is to keep his incision dry for the next 7 days. He can  sponge bathe until Friday, January 27th.   FOLLOW UP:  He has several follow-up appointments:  1.  [facility], [facility], [place], [address]      Wednesday January 29, 2005 at 10:45 in the morning.  2.  A stress test will be planned at [facility].  The patient is an      elderly male who has not had a stress test for several years, and      perhaps maybe contemplating surgery in the near future.  This stress      test format is not available at  the time of this dictation.  3.  He also sees Dr. Andee Lineman at [facility] Cardiology [facility] office Wednesday      February 2006 at 12:45 p.m.  4.  He will see Dr. Graciela Husbands at the [facility]iology [facility] office Thursday,      April 24, 2005.  [name] [name] was asked to call the [facility] Cardiology [facility]      office Monday March 1 for the time of the visit on April 27th.   HISTORY:  [name] [name] is an 77 year old male. He has no prior cardiac history.  His risk factors include hypertension and diabetes.  He presented to  [facility] Emergency Room with 3 episodes of syncope in rapid  succession occurring January 20th.  While sitting at the breakfast table on  the morning of January 20h, he developed nausea and vomiting, apparently  aspirating per witnessed persons and fell onto the floor.  This was an  uncontrolled fall. He was assisted back to the chair by his wife and had a  second episode loss of consciousness.   Then there was a third episode at the emergency room.  He was documented to  have sinus pause with asystole for greater than 10 seconds.  He was  transferred to [facility] for urgent placement of pacemaker.  He  was seen there by Dr. [name].  Plan was for admission to the cardiac  intensive care unit. He will also have a 2-D echocardiogram and due to mild  anemia, iron profile, ferritin, guaiac stools, fasting lipid profile, TSH,  hemoglobin A1c possibly.   HOSPITAL COURSE:  [name] [name] was admitted to [facility] from the  [facility] Emergency Room with sick sinus syndrome, sinus arrest with  pauses greater than 10 seconds without ventricular escape rhythms  developing. The patient was urgently taken to the catheterization lab and  DDD percutaneous trans-venous demand pacer was placed by Dr. [name].   Patient has had no complications post-presentation and post-procedure. His hemoglobin had dropped to a low value of 9.3 on September 22 but has risen  again on  September  23 to 10. He will have a follow-up CBC at Dr. Margarita Mail  office visit. After a temperature spike on the day of presentation, the  patient has not presented with any further fevers over the weekend on  January 22 and 23rd and his white cell count has dropped from 14.1 on  presentation to 11.1 on day of discharge, January 23.   The patient has been started on Cipro for Proteus urinary tract infection.  Mobility issues have been discussed with the patient. He goes home on the  medications and follow-up as dictated.  He will have right upper extremity  radiculopathy investigated further by Dr. Danielle Dess in the near future; he has  an appointment with Dr. Danielle Dess January 29, 2005.   LABORATORY DATA:  On the day of discharge, CBC:  White cells 11.1,  hemoglobin 10, hematocrit 28.5, platelets 216.  Serum electrolytes on day of  discharge, January 23:  Sodium 129, potassium 4.3, chloride 96, carbon  dioxide 24, BUN 18, creatinine 1, glucose 201.  TSH this admission was  0.674. Fasting lipid profile: Cholesterol 112, triglycerides 37, HDL  cholesterol 58, LDL cholesterol 47.  Total iron on January 20 was 20, total  iron on January 16 was 16 (range is 42-135). Ferritin was 215, within normal  limits.  Blood cultures were taken x2, negative at the time of this  discharge.  Once again, a CBC will be obtained in follow-up as an  outpatient.                                               ______________________________  Maple Mirza, P.A.    GM/MEDQ  D:  01/20/2005  T:  01/20/2005  Job:  [id number]   cc:   Sherryl Manges, M.D. both offices   Learta Codding, M.D. [facility]   Wende Crease, M.D.   Stefani Dama, M.D.  [address]  [place]  [place] [address]  Fax: [phone]

## 2011-07-30 ENCOUNTER — Encounter: Payer: Self-pay | Admitting: Internal Medicine

## 2011-07-30 DIAGNOSIS — I498 Other specified cardiac arrhythmias: Secondary | ICD-10-CM

## 2011-10-01 ENCOUNTER — Encounter: Payer: Self-pay | Admitting: Cardiology

## 2011-10-15 ENCOUNTER — Telehealth: Payer: Self-pay | Admitting: Internal Medicine

## 2011-10-15 NOTE — Telephone Encounter (Signed)
Pt received letter stating that according to our records pt is not getting advice checked in the recommenced amount of time. Pt is getting device checked through [employer] [place] and bill states it is from Dr. Graciela Husbands at [place] office. The [employer] EOB shows where they are being paid too.   Pt got device check in January and the end of July this year by [employer] [place].    Please return call to discuss further.

## 2011-10-16 NOTE — Telephone Encounter (Signed)
Spoke with wife and explained that patient needs yearly in office pacemaker check in addition to quarterly checks with [employer].  Wife to call [facility] office and schedule with Dr.Allred.

## 2011-10-28 ENCOUNTER — Ambulatory Visit: Payer: Medicare Other | Admitting: Cardiology

## 2011-12-11 ENCOUNTER — Encounter: Payer: Self-pay | Admitting: Cardiology

## 2011-12-12 ENCOUNTER — Encounter: Payer: Self-pay | Admitting: Cardiology

## 2011-12-12 ENCOUNTER — Encounter: Payer: Self-pay | Admitting: *Deleted

## 2011-12-12 ENCOUNTER — Ambulatory Visit (INDEPENDENT_AMBULATORY_CARE_PROVIDER_SITE_OTHER): Payer: Medicare Other | Admitting: Cardiology

## 2011-12-12 VITALS — BP 124/66 | HR 64 | Ht 71.0 in | Wt 218.0 lb

## 2011-12-12 DIAGNOSIS — E785 Hyperlipidemia, unspecified: Secondary | ICD-10-CM

## 2011-12-12 DIAGNOSIS — I2510 Atherosclerotic heart disease of native coronary artery without angina pectoris: Secondary | ICD-10-CM

## 2011-12-12 DIAGNOSIS — R0602 Shortness of breath: Secondary | ICD-10-CM

## 2011-12-12 DIAGNOSIS — R072 Precordial pain: Secondary | ICD-10-CM

## 2011-12-12 DIAGNOSIS — I509 Heart failure, unspecified: Secondary | ICD-10-CM

## 2011-12-12 NOTE — Patient Instructions (Signed)
Your physician wants you to follow-up in: 6 months. You will receive a reminder letter in the mail one-two months in advance. If you don't receive a letter, please call our office to schedule the follow-up appointment. Your physician recommends that you continue on your current medications as directed. Please refer to the Current Medication list given to you today. Your physician has requested that you have a dobutamine echocardiogram. For further information please visit [email or web address] Please follow instruction sheet as given. If the results of your test are normal or stable, you will receive a letter. If they are abnormal, the nurse will contact you by phone.

## 2011-12-13 NOTE — Progress Notes (Signed)
[name] Bottoms, MD, [facility] ABIM Board Certified in Adult Cardiovascular Medicine,Internal Medicine and Critical Care Medicine    CC: Increased shortness of breath in a patient with coronary artery disease  HPI:  The patient is an 77 year old male, legally blind with a history of coronary artery disease status post bare-metal stent of circumflex in 2008, albeit with normal LV systolic function but significant diastolic dysfunction. He is also status post pacemaker implantation. He reports increased shortness of breath. Particularly his wife is concerned about this as she has noted that when they walk in the mall he really cannot go very far without getting short of breath. Apparently when he walked in his primary care physician office his saturation dropped to 91%. His wife is wondering if she could monitor his oxygen saturation in the future. I have given her information regarding obtaining small pulse oximeter is apprised of $25-$30 from [email or web address]. The patient denies any chest pain however. He has no orthopnea or PND palpitations or syncope. He also had a recent echocardiogram done by his primary care physician which showed moderate tricuspid regurgitation and moderate pulmonary hypertension with normal LV systolic function. Reportedly there were no segmental wall motion abnormalities. Lipid panel was drawn and an LDL was measured at 58 mg percent HDL of 65 mg percent and GFR at 52 mL per minute     PMH: reviewed and listed in Problem List in Electronic Records (and see below) Past Medical History  Diagnosis Date  . Acute diastolic heart failure   . Hyposmolality and/or hyponatremia   . Congestive heart failure, unspecified   . Coronary atherosclerosis of native coronary artery   . Postsurgical percutaneous transluminal coronary angioplasty status   . Other and unspecified hyperlipidemia   . Obstructive sleep apnea (adult) (pediatric)   . Unspecified hypertensive kidney disease with  chronic kidney disease stage I through stage IV, or unspecified   . Chronic kidney disease, unspecified   . Macular degeneration (senile) of retina, unspecified   . Peripheral vascular disease, unspecified   . Anemia, unspecified   . Alzheimer's disease   . Type II or unspecified type diabetes mellitus without mention of complication, not stated as uncontrolled   . Cardiac pacemaker in situ   . Encounter for long-term (current) use of aspirin    Past Surgical History  Procedure Date  . Implantation of a dual-chamber pacemaker January 17, 2005  . Appendectomy   . Cholecystectomy   . Cataract surgery      Allergies/SH/FHX : available in Electronic Records for review and reviewed today  Medications: Current Outpatient Prescriptions  Medication Sig Dispense Refill  . amLODipine (NORVASC) 10 MG tablet Take 10 mg by mouth daily.        Marland Kitchen aspirin 325 MG tablet Take 325 mg by mouth daily.        . benazepril (LOTENSIN) 40 MG tablet Take 40 mg by mouth daily.        . beta carotene w/minerals (OCUVITE) tablet Take 1 tablet by mouth daily.        . bumetanide (BUMEX) 1 MG tablet Take 1 mg by mouth daily.        . Calcium Citrate (CAL-CITRATE PO) Take 1 tablet by mouth daily.        Marland Kitchen CINNAMON PO Take 1 capsule by mouth daily. 1000MG        . Coenzyme Q10 (COQ-10) 50 MG CAPS Take 1 capsule by mouth daily.        Marland Kitchen  donepezil (ARICEPT) 10 MG tablet Take 10 mg by mouth at bedtime.       Marland Kitchen doxazosin (CARDURA) 4 MG tablet Take 4 mg by mouth at bedtime.        . Fish Oil OIL 2,100 mg by Does not apply route daily.        . flunisolide (NASALIDE) 0.025 % SOLN Inhale 2 sprays into the lungs 2 (two) times daily.        Marland Kitchen glimepiride (AMARYL) 2 MG tablet Take 1 tablet by mouth Daily.      . insulin NPH (HUMULIN N,NOVOLIN N) 100 UNIT/ML injection Inject into the skin.        . Methylcellulose, Laxative, (CITRUCEL PO) Take 1 tablet by mouth daily.        . Multiple Vitamin (MULTIVITAMIN) tablet Take 1  tablet by mouth daily.        . pravastatin (PRAVACHOL) 20 MG tablet Take 20 mg by mouth daily.        . pregabalin (LYRICA) 75 MG capsule Take 75 mg by mouth daily.       . traMADol (ULTRAM) 50 MG tablet Take 50 mg by mouth 2 (two) times daily. Maximum dose= 8 tablets per day        ROS: No nausea or vomiting. No fever or chills.No melena or hematochezia.No bleeding.No claudication. Dyspnea as outlined above  Physical Exam: BP 124/66  Pulse 64  Ht 5\' 11"  (1.803 m)  Wt 218 lb (98.884 kg)  BMI 30.40 kg/m2  SpO2 95% General: Well-nourished white male in no distress legally blind Neck: Normal carotid upstroke with no carotid bruits. No thyromegaly nonnodular thyroid. JVP is 6 cm. Lungs: Clear breath sounds bilaterally without wheezing Cardiac: Regular rate and rhythm with normal S1-S2 no murmur rubs or gallops. Vascular: No edema. Normal distal pulses bilaterally Skin: Warm and dry  12lead ECG: Not obtained. Limited bedside ECHO:N/A   Assessment and Plan

## 2011-12-13 NOTE — Assessment & Plan Note (Signed)
Patient has a history of diastolic heart failure. He has moderate pulmonary hypertension with normal LV systolic function. His shortness of breath could be related to diastolic dysfunction, although his blood pressure is well controlled. I told the patient however given his history of coronary artery disease that his shortness of breath could be an angina equivalent. He has not tried to take any nitroglycerin. No specific comments were made recently on the echocardiogram but the patient has significant and worsening diastolic dysfunction. He certainly has an element of deconditioning. He does not appear to be volume overloaded.

## 2011-12-13 NOTE — Assessment & Plan Note (Signed)
The patient has a prior history of coronary disease with stent placement to the circumflex coronary artery. We will proceed with a dobutamine echocardiogram to make sure that he does not have significant worsening of his coronary artery disease. This would not necessarily lead to cardiac catheterization given his advanced age, but depending on the severity of the abnormalities we will decide if we can initially try medical therapy if indeed there are any changes.

## 2011-12-13 NOTE — Assessment & Plan Note (Signed)
Lipid panels at goal. This can be continued to be followed by his primary care physician

## 2011-12-17 ENCOUNTER — Telehealth: Payer: Self-pay | Admitting: *Deleted

## 2011-12-17 ENCOUNTER — Other Ambulatory Visit: Payer: Self-pay | Admitting: Cardiology

## 2011-12-17 DIAGNOSIS — R072 Precordial pain: Secondary | ICD-10-CM

## 2011-12-17 DIAGNOSIS — R0602 Shortness of breath: Secondary | ICD-10-CM

## 2011-12-17 NOTE — Telephone Encounter (Signed)
[name] [name] is scheduled for a Dobutamine Echo on 12/15/2011 at [facility]. His wife is concerned that he is a diabetic and going without food before having his test.

## 2011-12-17 NOTE — Telephone Encounter (Signed)
DOBUTAMINE ECHO scheduled for 12-25-2011 @ [facility] Checking percert

## 2011-12-18 NOTE — Telephone Encounter (Signed)
Auth # [id number] exp 02/01/12

## 2011-12-25 DIAGNOSIS — R079 Chest pain, unspecified: Secondary | ICD-10-CM

## 2012-01-14 ENCOUNTER — Encounter: Payer: Self-pay | Admitting: Internal Medicine

## 2012-03-17 ENCOUNTER — Ambulatory Visit (INDEPENDENT_AMBULATORY_CARE_PROVIDER_SITE_OTHER): Payer: Medicare Other | Admitting: Internal Medicine

## 2012-03-17 ENCOUNTER — Encounter: Payer: Self-pay | Admitting: Internal Medicine

## 2012-03-17 VITALS — BP 134/61 | HR 61 | Ht 71.0 in | Wt 218.0 lb

## 2012-03-17 DIAGNOSIS — I498 Other specified cardiac arrhythmias: Secondary | ICD-10-CM

## 2012-03-17 DIAGNOSIS — R001 Bradycardia, unspecified: Secondary | ICD-10-CM

## 2012-03-17 LAB — PACEMAKER DEVICE OBSERVATION
AL IMPEDENCE PM: 430 Ohm
AL THRESHOLD: 0.5 V
ATRIAL PACING PM: 27
BATTERY VOLTAGE: 2.79 V
RV LEAD IMPEDENCE PM: 424 Ohm

## 2012-03-17 NOTE — Patient Instructions (Signed)
Your physician wants you to follow-up in: 6 months with Belenda Cruise and 1 year with Dr. Johney Frame. You will receive a reminder letter in the mail one-two months in advance. If you don't receive a letter, please call our office to schedule the follow-up appointment.  Your physician recommends that you continue on your current medications as directed. Please refer to the Current Medication list given to you today.

## 2012-03-17 NOTE — Assessment & Plan Note (Signed)
Normal pacemaker function See Pace Art report No changes today  

## 2012-03-17 NOTE — Progress Notes (Signed)
PCP:  [name]., MD, MD Primary Cardiologist:  Janyth Pupa  The patient presents today for routine electrophysiology followup.  Since last being seen in our clinic, the patient reports doing reasonably well.  He remains active for his age. Today, he denies symptoms of palpitations, chest pain, shortness of breath,   lower extremity edema (above baseline), dizziness, presyncope, syncope, or neurologic sequela.  The patient feels that he is tolerating medications without difficulties and is otherwise without complaint today.   Past Medical History  Diagnosis Date  . Chronic diastolic HF (heart failure)   . Hyposmolality and/or hyponatremia   . Coronary atherosclerosis of native coronary artery   . Postsurgical percutaneous transluminal coronary angioplasty status   . Other and unspecified hyperlipidemia   . Obstructive sleep apnea (adult) (pediatric)   . Chronic kidney disease, unspecified   . Macular degeneration (senile) of retina, unspecified   . Peripheral vascular disease, unspecified   . Anemia, unspecified   . Alzheimer's disease   . Type II or unspecified type diabetes mellitus without mention of complication, not stated as uncontrolled   . Bradycardia     s/p PPM  . Encounter for long-term (current) use of aspirin    Past Surgical History  Procedure Date  . Implantation of a dual-chamber pacemaker January 17, 2005  . Appendectomy   . Cholecystectomy   . Cataract surgery     Current Outpatient Prescriptions  Medication Sig Dispense Refill  . amLODipine (NORVASC) 10 MG tablet Take 10 mg by mouth daily.        Marland Kitchen aspirin 325 MG tablet Take 325 mg by mouth daily.        . benazepril (LOTENSIN) 40 MG tablet Take 40 mg by mouth daily.        . beta carotene w/minerals (OCUVITE) tablet Take 1 tablet by mouth daily.        . bumetanide (BUMEX) 1 MG tablet Take 1 mg by mouth daily.        . Calcium Citrate (CAL-CITRATE PO) Take 1 tablet by mouth daily.        Marland Kitchen CINNAMON PO Take  1 capsule by mouth daily. 1000MG        . Coenzyme Q10 (COQ-10) 50 MG CAPS Take 1 capsule by mouth daily.        Marland Kitchen donepezil (ARICEPT) 10 MG tablet Take 10 mg by mouth at bedtime.       Marland Kitchen doxazosin (CARDURA) 4 MG tablet Take 4 mg by mouth at bedtime.        . Fish Oil OIL 2,100 mg by Does not apply route daily.        . flunisolide (NASALIDE) 0.025 % SOLN Inhale 2 sprays into the lungs 2 (two) times daily.        Marland Kitchen glimepiride (AMARYL) 2 MG tablet Take 1 tablet by mouth Daily.      . insulin NPH (HUMULIN N,NOVOLIN N) 100 UNIT/ML injection Inject into the skin.        . Methylcellulose, Laxative, (CITRUCEL PO) Take 1 tablet by mouth daily.        . Multiple Vitamin (MULTIVITAMIN) tablet Take 1 tablet by mouth daily.        . pravastatin (PRAVACHOL) 20 MG tablet Take 20 mg by mouth daily.        . pregabalin (LYRICA) 75 MG capsule Take 75 mg by mouth daily.       . traMADol (ULTRAM) 50 MG tablet Take 50 mg by  mouth 2 (two) times daily. Maximum dose= 8 tablets per day        Allergies  Allergen Reactions  . Diclofenac   . Gabapentin     REACTION: hallucinate    History   Social History  . Marital Status: Married    Spouse Name: [name]    Number of Children: N/A  . Years of Education: N/A   Occupational History  . RETIRED    Social History Main Topics  . Smoking status: Never Smoker   . Smokeless tobacco: Never Used  . Alcohol Use: No  . Drug Use: No  . Sexually Active: Not on file   Other Topics Concern  . Not on file   Social History Narrative  . No narrative on file    Physical Exam: Filed Vitals:   03/17/12 1045  BP: 134/61  Pulse: 61  Height: 5\' 11"  (1.803 m)  Weight: 218 lb (98.884 kg)    GEN- The patient is elderly appearing, alert and oriented x 3 today.   Head- normocephalic, atraumatic Eyes-  Sclera clear, conjunctiva pink Ears- hearing intact Oropharynx- clear Neck- supple, no JVP Lymph- no cervical lymphadenopathy Lungs- Clear to ausculation  bilaterally, normal work of breathing Chest- pacemaker pocket is well healed Heart- Regular rate and rhythm, 2/6 SEM LUSB (A2 is audible) GI- soft, NT, ND, + BS Extremities- no clubbing, cyanosis, 1+ edema  Pacemaker interrogation- reviewed in detail today,  See PACEART report  Assessment and Plan:

## 2012-06-15 ENCOUNTER — Ambulatory Visit (INDEPENDENT_AMBULATORY_CARE_PROVIDER_SITE_OTHER): Payer: Medicare Other | Admitting: Cardiology

## 2012-06-15 ENCOUNTER — Encounter: Payer: Self-pay | Admitting: Cardiology

## 2012-06-15 VITALS — BP 133/62 | HR 64 | Ht 69.0 in | Wt 222.0 lb

## 2012-06-15 DIAGNOSIS — Z950 Presence of cardiac pacemaker: Secondary | ICD-10-CM

## 2012-06-15 DIAGNOSIS — I5032 Chronic diastolic (congestive) heart failure: Secondary | ICD-10-CM

## 2012-06-15 DIAGNOSIS — G4733 Obstructive sleep apnea (adult) (pediatric): Secondary | ICD-10-CM

## 2012-06-15 DIAGNOSIS — R0602 Shortness of breath: Secondary | ICD-10-CM

## 2012-06-15 DIAGNOSIS — I2510 Atherosclerotic heart disease of native coronary artery without angina pectoris: Secondary | ICD-10-CM

## 2012-06-15 DIAGNOSIS — R609 Edema, unspecified: Secondary | ICD-10-CM

## 2012-06-15 DIAGNOSIS — I498 Other specified cardiac arrhythmias: Secondary | ICD-10-CM

## 2012-06-15 DIAGNOSIS — R001 Bradycardia, unspecified: Secondary | ICD-10-CM

## 2012-06-15 DIAGNOSIS — I5033 Acute on chronic diastolic (congestive) heart failure: Secondary | ICD-10-CM

## 2012-06-15 MED ORDER — BUMETANIDE 2 MG PO TABS: 2.0000 mg | ORAL_TABLET | Freq: Every day | ORAL | Status: DC

## 2012-06-15 NOTE — Assessment & Plan Note (Signed)
Stable no recurrent chest pain.

## 2012-06-15 NOTE — Progress Notes (Signed)
[name] Bottoms, MD, [facility] ABIM Board Certified in Adult Cardiovascular Medicine,Internal Medicine and Critical Care Medicine    CC: Follow patient with a history of coronary disease and diastolic heart failure  HPI:  The patient was recently hospitalized for severe pneumonia. Apparently he was quite hypoxemic. He required oxygen for a period of time after his treatment. He states feeling much better. He reports no chest pain. His wife has purchased a pulse oximeter and she monitors his oxygen saturation both at rest on exertion. Reportedly saturations have never been less than 92%. The patient is limited in his activity both because of his legal blindness and also because of hip and back problems. He does get short of breath on exertion but this has been chronic and is been no change in his pattern. He denies any substernal chest pain. He drinks quite a bit of fluids and at times his wife doubles up on his Bumex. He states that his mouth is dry all the time and uses hard candy for symptom improvement. The patient reports no palpitations presyncope or syncope.  PMH: reviewed and listed in Problem List in Electronic Records (and see below) Past Medical History  Diagnosis Date  . Chronic diastolic HF (heart failure)   . Hyposmolality and/or hyponatremia   . Coronary atherosclerosis of native coronary artery   . Postsurgical percutaneous transluminal coronary angioplasty status   . Other and unspecified hyperlipidemia   . Obstructive sleep apnea (adult) (pediatric)   . Chronic kidney disease, unspecified   . Macular degeneration (senile) of retina, unspecified   . Peripheral vascular disease, unspecified   . Anemia, unspecified   . Alzheimer's disease   . Type II or unspecified type diabetes mellitus without mention of complication, not stated as uncontrolled   . Bradycardia     s/p PPM  . Encounter for long-term (current) use of aspirin    Past Surgical History  Procedure Date  .  Implantation of a dual-chamber pacemaker January 17, 2005  . Appendectomy   . Cholecystectomy   . Cataract surgery     Allergies/SH/FHX : available in Electronic Records for review  Allergies  Allergen Reactions  . Diclofenac   . Gabapentin     REACTION: hallucinate   History   Social History  . Marital Status: Married    Spouse Name: [name]    Number of Children: N/A  . Years of Education: N/A   Occupational History  . RETIRED    Social History Main Topics  . Smoking status: Never Smoker   . Smokeless tobacco: Never Used  . Alcohol Use: No  . Drug Use: No  . Sexually Active: Not on file   Other Topics Concern  . Not on file   Social History Narrative  . No narrative on file   No family history on file.  Medications: Current Outpatient Prescriptions  Medication Sig Dispense Refill  . amLODipine (NORVASC) 10 MG tablet Take 10 mg by mouth daily.        Marland Kitchen aspirin 325 MG tablet Take 325 mg by mouth daily.        . benazepril (LOTENSIN) 40 MG tablet Take 40 mg by mouth daily.        . beta carotene w/minerals (OCUVITE) tablet Take 1 tablet by mouth daily.        . bumetanide (BUMEX) 1 MG tablet Take 1 mg by mouth daily.        . Calcium Citrate (CAL-CITRATE PO) Take 1  tablet by mouth daily.        Marland Kitchen CINNAMON PO Take 1 capsule by mouth daily. 1000MG        . Coenzyme Q10 (COQ-10) 50 MG CAPS Take 1 capsule by mouth daily.        Marland Kitchen donepezil (ARICEPT) 10 MG tablet Take 10 mg by mouth at bedtime.       Marland Kitchen doxazosin (CARDURA) 4 MG tablet Take 4 mg by mouth at bedtime.        . Fish Oil OIL 2,100 mg by Does not apply route daily.        . flunisolide (NASALIDE) 0.025 % SOLN Inhale 2 sprays into the lungs 2 (two) times daily.        Marland Kitchen glimepiride (AMARYL) 2 MG tablet Take 1 tablet by mouth Daily.      . insulin NPH (HUMULIN N,NOVOLIN N) 100 UNIT/ML injection Inject into the skin. Inject 36 units in the AM and 26 units in the PM.      . Methylcellulose, Laxative, (CITRUCEL  PO) Take 1 tablet by mouth daily.        . Multiple Vitamin (MULTIVITAMIN) tablet Take 1 tablet by mouth daily.        . pravastatin (PRAVACHOL) 20 MG tablet Take 20 mg by mouth daily.        . pregabalin (LYRICA) 75 MG capsule Take 75 mg by mouth daily.       . traMADol (ULTRAM) 50 MG tablet Take 50 mg by mouth 2 (two) times daily. Maximum dose= 8 tablets per day      . bumetanide (BUMEX) 2 MG tablet Take 1 tablet (2 mg total) by mouth daily. X 7 days only, then resume 1mg  daily  7 tablet  0    ROS: No nausea or vomiting. No fever or chills.No melena or hematochezia.No bleeding.No claudication  Physical Exam: BP 133/62  Pulse 64  Ht 5\' 9"  (1.753 m)  Wt 222 lb (100.699 kg)  BMI 32.78 kg/m2 General: Well-nourished white male in no distress. Legally blind using a cane Neck: Normal carotid upstroke no carotid bruits. No thyromegaly nonnodular thyroid. JVP 6 cm Lungs: Crackles bilaterally approximately 1/3 of the way up Cardiac: Regular rate and rhythm with normal S1 S2-1 2/6 holosystolic murmur at the left lower sternal border Vascular: 2+ pitting edema. Normal distal pulses Skin: Warm and dry Physcologic: Normal affect  12lead ECG: Not performed Limited bedside ECHO:N/A No images are attached to the encounter.   Assessment and Plan  ACUTE ON CHRONIC DIASTOLIC HEART FAILURE Patient has crackles on examination of the lung bases. He has known moderate tricuspid regurgitation moderate pulmonary hypertension. He also developed increased edema. He also drinks quite a bit of free water. I instructed the patient to take Bumex 2 mg by mouth daily for 7 days then resume 1 mg by mouth daily in the interim decrease his overall fluid intake significantly. The patient reports a dry mouth but can use sugar-free hard candy. Followup elective I panel will be drawn in 10 days. The patient also needs a followup echocardiogram to assess his tricuspid regurgitation as well as diastolic  parameters.  CARDIAC PACEMAKER IN SITU Followed in the pacemaker clinic.  CORONARY ATHEROSCLEROSIS NATIVE CORONARY ARTERY Stable no recurrent chest pain.  OBSTRUCTIVE SLEEP APNEA Patient remains on CPAP. His wife also closely monitored his oxygen saturation with a home pulse oximeter. Saturations have been typically greater than 92% even on exertion. The patient is currently not using oxygen.  Patient Active Problem List  Diagnosis  . DM  . PURE HYPERCHOLESTEROLEMIA  . HYPERLIPIDEMIA  . HYPOSMOLALITY AND/OR HYPONATREMIA  . 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 OBSTRUCTIVE SLEEP APNEA  . ALZHEIMERS DISEASE  . MACULAR DEGENERATION OF RETINA UNSPECIFIED  . LEGAL BLINDNESS, AS DEFINED IN [place]  . ESSENTIAL HYPERTENSION, BENIGN  . HTN CKD UNS W/CKD STAGE I THRU STAGE IV/UNS  . CORONARY ATHEROSCLEROSIS NATIVE CORONARY ARTERY  . ACUTE ON CHRONIC DIASTOLIC HEART FAILURE  . PVD  . CARDIAC PACEMAKER IN SITU  . POSTSURG PERCUT TRANSLUMINAL COR ANGPLSTY STS  . Bradycardia

## 2012-06-15 NOTE — Assessment & Plan Note (Signed)
Patient remains on CPAP. His wife also closely monitored his oxygen saturation with a home pulse oximeter. Saturations have been typically greater than 92% even on exertion. The patient is currently not using oxygen.

## 2012-06-15 NOTE — Assessment & Plan Note (Signed)
Patient has crackles on examination of the lung bases. He has known moderate tricuspid regurgitation moderate pulmonary hypertension. He also developed increased edema. He also drinks quite a bit of free water. I instructed the patient to take Bumex 2 mg by mouth daily for 7 days then resume 1 mg by mouth daily in the interim decrease his overall fluid intake significantly. The patient reports a dry mouth but can use sugar-free hard candy. Followup elective I panel will be drawn in 10 days. The patient also needs a followup echocardiogram to assess his tricuspid regurgitation as well as diastolic parameters.

## 2012-06-15 NOTE — Patient Instructions (Addendum)
   Echo  Our office will contact with results.    Bumex 2mg  daily x 7 days only, then resume 1mg  daily Your physician recommends that you go to the [facility] lab for blood work for [employer] in 10 days.  Your physician wants you to follow up in: 6 months.  You will receive a reminder letter in the mail one-two months in advance.  If you don't receive a letter, please call our office to schedule the follow up appointment

## 2012-06-15 NOTE — Assessment & Plan Note (Signed)
Followed in the pacemaker clinic.

## 2012-07-05 ENCOUNTER — Telehealth: Payer: Self-pay | Admitting: *Deleted

## 2012-07-05 NOTE — Telephone Encounter (Signed)
Notes Recorded by Lesle Chris, LPN on 02/02/3663 at 2:55 PM Wife Willaim Sheng) notified of below. Will forward to PMD ([name]).

## 2012-07-05 NOTE — Telephone Encounter (Signed)
Message copied by Lesle Chris on Mon Jul 05, 2012  2:55 PM ------      Message from: Learta Codding      Created: Tue Jun 29, 2012 10:40 AM       L;abs stable. Forward LMD

## 2012-07-14 ENCOUNTER — Other Ambulatory Visit: Payer: Self-pay

## 2012-07-14 ENCOUNTER — Other Ambulatory Visit (INDEPENDENT_AMBULATORY_CARE_PROVIDER_SITE_OTHER): Payer: Medicare Other

## 2012-07-14 DIAGNOSIS — R0602 Shortness of breath: Secondary | ICD-10-CM

## 2012-07-15 ENCOUNTER — Telehealth: Payer: Self-pay | Admitting: *Deleted

## 2012-07-15 NOTE — Telephone Encounter (Signed)
Notes Recorded by Lesle Chris, LPN on 04/06/8118 at 1:55 PM Wife notified of below.

## 2012-07-15 NOTE — Telephone Encounter (Signed)
Message copied by Lesle Chris on Thu Jul 15, 2012  1:55 PM ------      Message from: Prescott Parma C      Created: Thu Jul 15, 2012  1:10 PM       EF 65% to 70%, no regional wall motion abnormalities (grade 1 diastolic dysfunction), m/mod TR.

## 2012-08-31 ENCOUNTER — Encounter: Payer: Self-pay | Admitting: *Deleted

## 2012-08-31 DIAGNOSIS — Z950 Presence of cardiac pacemaker: Secondary | ICD-10-CM | POA: Insufficient documentation

## 2012-09-10 ENCOUNTER — Encounter: Payer: Self-pay | Admitting: Internal Medicine

## 2012-09-10 ENCOUNTER — Ambulatory Visit (INDEPENDENT_AMBULATORY_CARE_PROVIDER_SITE_OTHER): Payer: Medicare Other | Admitting: *Deleted

## 2012-09-10 DIAGNOSIS — Z950 Presence of cardiac pacemaker: Secondary | ICD-10-CM

## 2012-09-10 DIAGNOSIS — R001 Bradycardia, unspecified: Secondary | ICD-10-CM

## 2012-09-10 DIAGNOSIS — I498 Other specified cardiac arrhythmias: Secondary | ICD-10-CM

## 2012-09-10 LAB — PACEMAKER DEVICE OBSERVATION
BAMS-0001: 150 {beats}/min
RV LEAD IMPEDENCE PM: 425 Ohm
RV LEAD THRESHOLD: 1.5 V

## 2012-09-10 NOTE — Progress Notes (Signed)
Pacer check in clinic  

## 2013-07-13 ENCOUNTER — Encounter: Payer: Self-pay | Admitting: Internal Medicine

## 2013-07-13 ENCOUNTER — Ambulatory Visit (INDEPENDENT_AMBULATORY_CARE_PROVIDER_SITE_OTHER): Payer: Medicare Other | Admitting: Internal Medicine

## 2013-07-13 VITALS — BP 160/100 | HR 68 | Ht 71.0 in | Wt 168.1 lb

## 2013-07-13 DIAGNOSIS — I2510 Atherosclerotic heart disease of native coronary artery without angina pectoris: Secondary | ICD-10-CM

## 2013-07-13 DIAGNOSIS — I498 Other specified cardiac arrhythmias: Secondary | ICD-10-CM

## 2013-07-13 DIAGNOSIS — I10 Essential (primary) hypertension: Secondary | ICD-10-CM

## 2013-07-13 DIAGNOSIS — Z950 Presence of cardiac pacemaker: Secondary | ICD-10-CM

## 2013-07-13 DIAGNOSIS — R001 Bradycardia, unspecified: Secondary | ICD-10-CM

## 2013-07-13 LAB — PACEMAKER DEVICE OBSERVATION
AL THRESHOLD: 0.5 V
BAMS-0001: 150 {beats}/min
RV LEAD THRESHOLD: 1.5 V

## 2013-07-13 NOTE — Patient Instructions (Signed)
Your physician recommends that you schedule a follow-up appointment in: 6 months with device clinic Baxter Hire). You should receive a letter in the mail around November 2014 to schedule this appointment if you do not receive this letter call your office and schedule your appointment.   Your physician recommends that you schedule a follow-up appointment in: 1 year with Dr. Johney Frame. You should receive a letter in the mail around May 2015 to schedule this appointment if you do not receive this letter call your office and schedule your appointment.   Continue all your current medications. Refer to the list given to you today.

## 2013-07-13 NOTE — Progress Notes (Signed)
PCP:  [name]., MD Primary Cardiologist:  Previously Dr Andee Lineman  The patient presents today for routine electrophysiology followup.  Since last being seen in our clinic, the patient reports doing reasonably well. He had swelling recently which improved upon stopping norvasc. Today, he denies symptoms of palpitations, chest pain, shortness of breath, dizziness, presyncope, syncope, or neurologic sequela.  The patient feels that he is tolerating medications without difficulties and is otherwise without complaint today.   Past Medical History  Diagnosis Date  . Chronic diastolic HF (heart failure)   . Hyposmolality and/or hyponatremia   . Coronary atherosclerosis of native coronary artery   . Postsurgical percutaneous transluminal coronary angioplasty status   . Other and unspecified hyperlipidemia   . Obstructive sleep apnea (adult) (pediatric)   . Chronic kidney disease, unspecified   . Macular degeneration (senile) of retina, unspecified   . Peripheral vascular disease, unspecified   . Anemia, unspecified   . Alzheimer's disease   . Type II or unspecified type diabetes mellitus without mention of complication, not stated as uncontrolled   . Bradycardia     s/p PPM  . Encounter for long-term (current) use of aspirin    Past Surgical History  Procedure Laterality Date  . Implantation of a dual-chamber pacemaker  January 17, 2005  . Appendectomy    . Cholecystectomy    . Cataract surgery      Current Outpatient Prescriptions  Medication Sig Dispense Refill  . aspirin 325 MG tablet Take 325 mg by mouth daily.        . benazepril (LOTENSIN) 40 MG tablet Take 40 mg by mouth daily.        . bumetanide (BUMEX) 1 MG tablet Take 1 mg by mouth daily.        Marland Kitchen CINNAMON PO Take 1 capsule by mouth daily. 1000MG        . Coenzyme Q10 (COQ-10) 50 MG CAPS Take 1 capsule by mouth daily.        Marland Kitchen donepezil (ARICEPT) 10 MG tablet Take 10 mg by mouth at bedtime.       Marland Kitchen doxazosin (CARDURA) 4 MG  tablet Take 4 mg by mouth at bedtime.        . Fish Oil OIL 2,100 mg by Does not apply route daily.        . fluticasone (FLONASE) 50 MCG/ACT nasal spray 2 sprays. 2 sprays by Nasal route 2 times daily.      Marland Kitchen glimepiride (AMARYL) 2 MG tablet Take 1 tablet by mouth Daily.      . insulin NPH (HUMULIN N,NOVOLIN N) 100 UNIT/ML injection Inject into the skin. Inject 36 units in the AM and 26 units in the PM.      . Multiple Vitamin (MULTIVITAMIN) tablet Take 1 tablet by mouth daily.        . pravastatin (PRAVACHOL) 20 MG tablet Take 20 mg by mouth daily.        . pregabalin (LYRICA) 75 MG capsule Take 75 mg by mouth daily.       . traMADol (ULTRAM) 50 MG tablet Take 50 mg by mouth 2 (two) times daily. Maximum dose= 8 tablets per day       No current facility-administered medications for this visit.    Allergies  Allergen Reactions  . Diclofenac   . Gabapentin     REACTION: hallucinate    History   Social History  . Marital Status: Married    Spouse Name: [employer]  Number of Children: N/A  . Years of Education: N/A   Occupational History  . RETIRED    Social History Main Topics  . Smoking status: Never Smoker   . Smokeless tobacco: Never Used  . Alcohol Use: No  . Drug Use: No  . Sexually Active: Not on file   Other Topics Concern  . Not on file   Social History Narrative  . No narrative on file    Physical Exam: Filed Vitals:   07/13/13 1520  BP: 160/100  Pulse: 68  Height: 5\' 11"  (1.803 m)  Weight: 168 lb 1.9 oz (76.259 kg)    GEN- The patient is elderly appearing, alert and oriented x 3 today.   Head- normocephalic, atraumatic Eyes-  Poor vision Ears- hearing intact Oropharynx- clear Neck- supple, no JVP Lymph- no cervical lymphadenopathy Lungs- Clear to ausculation bilaterally, normal work of breathing Chest- pacemaker pocket is well healed Heart- Regular rate and rhythm, 2/6 SEM LUSB (A2 is audible) GI- soft, NT, ND, + BS Extremities- no clubbing,  cyanosis, 1+ edema Walks slowly with a cane  Pacemaker interrogation- reviewed in detail today,  See PACEART report  Assessment and Plan:  1. Symptomatic bradycardia Normal pacemaker function See Pace Art report No changes today  2. HTN Above goal today, though he reports good BP control previously This may be due to stopping norvasc recently I have encouraged him to follow his BP at home and follow-up with Dr Sherril Croon  3. Chronic diastolic dysfunction Stable No change required today  4. CAD No ischemic symptoms Stable No change required today  Return in 6 months to see Belenda Cruise I will see in a year

## 2013-07-20 DIAGNOSIS — E162 Hypoglycemia, unspecified: Secondary | ICD-10-CM

## 2013-07-25 ENCOUNTER — Encounter: Payer: Self-pay | Admitting: Internal Medicine

## 2014-01-16 ENCOUNTER — Ambulatory Visit (INDEPENDENT_AMBULATORY_CARE_PROVIDER_SITE_OTHER): Payer: Medicare Other | Admitting: *Deleted

## 2014-01-16 DIAGNOSIS — I498 Other specified cardiac arrhythmias: Secondary | ICD-10-CM

## 2014-01-16 DIAGNOSIS — R001 Bradycardia, unspecified: Secondary | ICD-10-CM

## 2014-01-16 LAB — MDC_IDC_ENUM_SESS_TYPE_INCLINIC
Battery Impedance: 1000 Ohm
Battery Voltage: 2.78 V
Brady Statistic AP VS Percent: 6.7 %
Brady Statistic AS VP Percent: 0.3 %
Date Time Interrogation Session: 20150119200405
Lead Channel Impedance Value: 459 Ohm
Lead Channel Pacing Threshold Amplitude: 1 V
Lead Channel Pacing Threshold Pulse Width: 0.4 ms
Lead Channel Setting Pacing Amplitude: 3 V
MDC IDC MSMT BATTERY REMAINING LONGEVITY: 51 mo
MDC IDC MSMT LEADCHNL RA SENSING INTR AMPL: 2.8 mV
MDC IDC MSMT LEADCHNL RV IMPEDANCE VALUE: 454 Ohm
MDC IDC MSMT LEADCHNL RV PACING THRESHOLD AMPLITUDE: 1.5 V
MDC IDC MSMT LEADCHNL RV PACING THRESHOLD PULSEWIDTH: 0.4 ms
MDC IDC MSMT LEADCHNL RV SENSING INTR AMPL: 11.2 mV
MDC IDC SET LEADCHNL RA PACING AMPLITUDE: 2 V
MDC IDC SET LEADCHNL RV PACING PULSEWIDTH: 0.4 ms
MDC IDC SET LEADCHNL RV SENSING SENSITIVITY: 2.8 mV
MDC IDC STAT BRADY AP VP PERCENT: 53.6 %
MDC IDC STAT BRADY AS VS PERCENT: 39.4 %

## 2014-01-16 NOTE — Progress Notes (Signed)
Pacemaker check in clinic. Normal device function. Battery longevity 4 years. 29 mode switches--longest was 7 minutes 40 seconds. No Ventricular high rate episodes. [place] in 6 mths [place].

## 2014-01-25 ENCOUNTER — Encounter: Payer: Self-pay | Admitting: Internal Medicine

## 2014-07-13 ENCOUNTER — Encounter: Payer: Medicare Other | Admitting: Internal Medicine

## 2014-07-18 ENCOUNTER — Encounter: Payer: Self-pay | Admitting: Internal Medicine

## 2014-07-18 ENCOUNTER — Ambulatory Visit (INDEPENDENT_AMBULATORY_CARE_PROVIDER_SITE_OTHER): Payer: Medicare Other | Admitting: Internal Medicine

## 2014-07-18 ENCOUNTER — Encounter: Payer: Medicare Other | Admitting: Internal Medicine

## 2014-07-18 VITALS — BP 126/73 | HR 67 | Ht 71.0 in | Wt 176.0 lb

## 2014-07-18 DIAGNOSIS — Z950 Presence of cardiac pacemaker: Secondary | ICD-10-CM

## 2014-07-18 DIAGNOSIS — I498 Other specified cardiac arrhythmias: Secondary | ICD-10-CM

## 2014-07-18 DIAGNOSIS — I10 Essential (primary) hypertension: Secondary | ICD-10-CM

## 2014-07-18 DIAGNOSIS — R001 Bradycardia, unspecified: Secondary | ICD-10-CM

## 2014-07-18 LAB — MDC_IDC_ENUM_SESS_TYPE_INCLINIC
Battery Remaining Longevity: 54 mo
Brady Statistic AP VP Percent: 11.3 %
Brady Statistic AS VS Percent: 77.4 %
Lead Channel Impedance Value: 446 Ohm
Lead Channel Pacing Threshold Amplitude: 1 V
Lead Channel Sensing Intrinsic Amplitude: 11.2 mV
Lead Channel Setting Pacing Amplitude: 2 V
Lead Channel Setting Pacing Pulse Width: 0.4 ms
Lead Channel Setting Sensing Sensitivity: 2.8 mV
MDC IDC MSMT BATTERY IMPEDANCE: 1240 Ohm
MDC IDC MSMT BATTERY VOLTAGE: 2.78 V
MDC IDC MSMT LEADCHNL RA IMPEDANCE VALUE: 483 Ohm
MDC IDC MSMT LEADCHNL RA PACING THRESHOLD AMPLITUDE: 1 V
MDC IDC MSMT LEADCHNL RA PACING THRESHOLD PULSEWIDTH: 0.4 ms
MDC IDC MSMT LEADCHNL RA SENSING INTR AMPL: 2.8 mV
MDC IDC MSMT LEADCHNL RV PACING THRESHOLD PULSEWIDTH: 0.4 ms
MDC IDC SESS DTM: 20150721122558
MDC IDC SET LEADCHNL RV PACING AMPLITUDE: 2.5 V
MDC IDC STAT BRADY AP VS PERCENT: 8.6 %
MDC IDC STAT BRADY AS VP PERCENT: 2.7 %

## 2014-07-18 NOTE — Patient Instructions (Signed)
Your physician recommends that you schedule a follow-up appointment in: 6 months with Kristen in the device clinic at the office. You will receive a reminder letter in the mail in about 4 months reminding you to call and schedule your appointment. If you don't receive this letter, please contact our office. Your physician recommends that you schedule a follow-up appointment in: 1 year with Dr. Allred. You will receive a reminder letter in the mail in about 10 months reminding you to call and schedule your appointment. If you don't receive this letter, please contact our office. Your physician recommends that you continue on your current medications as directed. Please refer to the Current Medication list given to you today. 

## 2014-07-18 NOTE — Progress Notes (Signed)
PCP:  [name]., MD Primary Cardiologist:  Previously Dr Andee Lineman  The patient presents today for routine electrophysiology followup. The patient continues to have slow decline.  He had syncope several months ago while in the bathroom.  This was (per pts wife) felt to be vasovagal in etiology.  He also aspirated.  He was admitted to [facility] and evaluated.  He has done reasonably well since, without recurrent syncope. Today, he denies symptoms of palpitations, chest pain, shortness of breath, dizziness,  or neurologic sequela.  The patient feels that he is tolerating medications without difficulties and is otherwise without complaint today.   Past Medical History  Diagnosis Date  . Chronic diastolic HF (heart failure)   . Hyposmolality and/or hyponatremia   . Coronary atherosclerosis of native coronary artery   . Postsurgical percutaneous transluminal coronary angioplasty status   . Other and unspecified hyperlipidemia   . Obstructive sleep apnea (adult) (pediatric)   . Chronic kidney disease, unspecified   . Macular degeneration (senile) of retina, unspecified   . Peripheral vascular disease, unspecified   . Anemia, unspecified   . Alzheimer's disease   . Type II or unspecified type diabetes mellitus without mention of complication, not stated as uncontrolled   . Bradycardia     s/p PPM  . Encounter for long-term (current) use of aspirin    Past Surgical History  Procedure Laterality Date  . Implantation of a dual-chamber pacemaker  January 17, 2005  . Appendectomy    . Cholecystectomy    . Cataract surgery      Current Outpatient Prescriptions  Medication Sig Dispense Refill  . benazepril (LOTENSIN) 40 MG tablet Take 40 mg by mouth daily.        . bumetanide (BUMEX) 1 MG tablet Take 1 mg by mouth daily.        Marland Kitchen CINNAMON PO Take 1 capsule by mouth daily. 1000MG        . Coenzyme Q10 (COQ-10) 50 MG CAPS Take 1 capsule by mouth daily.        Marland Kitchen donepezil (ARICEPT) 10 MG  tablet Take 10 mg by mouth at bedtime.       Marland Kitchen doxazosin (CARDURA) 4 MG tablet Take 2 mg by mouth at bedtime.       . insulin glargine (LANTUS) 100 UNIT/ML injection Inject 30 Units into the skin at bedtime.       . insulin NPH (HUMULIN N,NOVOLIN N) 100 UNIT/ML injection Inject into the skin. Inject 10 units in the AM and 10 units in the lunch      . metoprolol tartrate (LOPRESSOR) 25 MG tablet Take 25 mg by mouth daily.      . Multiple Vitamin (MULTIVITAMIN) tablet Take 1 tablet by mouth daily.        . pravastatin (PRAVACHOL) 20 MG tablet Take 20 mg by mouth daily.        . pregabalin (LYRICA) 75 MG capsule Take 75 mg by mouth daily.       . Fish Oil OIL 2,100 mg by Does not apply route daily.        . fluticasone (FLONASE) 50 MCG/ACT nasal spray 2 sprays. 2 sprays by Nasal route 2 times daily.       No current facility-administered medications for this visit.    Allergies  Allergen Reactions  . Diclofenac   . Gabapentin     REACTION: hallucinate    History   Social History  . Marital Status: Married  Spouse Name: [name]    Number of Children: N/A  . Years of Education: N/A   Occupational History  . RETIRED    Social History Main Topics  . Smoking status: Never Smoker   . Smokeless tobacco: Never Used  . Alcohol Use: No  . Drug Use: No  . Sexual Activity: Not on file   Other Topics Concern  . Not on file   Social History Narrative  . No narrative on file    Physical Exam: Filed Vitals:   07/18/14 1204  BP: 126/73  Pulse: 67  Height: 5\' 11"  (1.803 m)  Weight: 176 lb (79.833 kg)    GEN- The patient is elderly appearing, alert and oriented x 3 today.   Head- normocephalic, atraumatic Eyes-  Poor vision Ears- hearing intact Oropharynx- clear Neck- supple  Lungs- Clear to ausculation bilaterally, normal work of breathing Chest- pacemaker pocket is well healed Heart- Regular rate and rhythm, 2/6 SEM LUSB (A2 is audible) GI- soft, NT, ND, +  BS Extremities- no clubbing, cyanosis, 1+ edema Walks slowly with a cane  Pacemaker interrogation- reviewed in detail today,  See PACEART report  Assessment and Plan:  1. Symptomatic bradycardia Normal pacemaker function See Pace Art report No changes today  2. HTN Stable No change required today  3. Chronic diastolic dysfunction Stable No change required today  4. CAD No ischemic symptoms Stable No change required today  Return in 6 months to see Belenda CruiseKristin I will see in a year

## 2015-01-05 ENCOUNTER — Ambulatory Visit (INDEPENDENT_AMBULATORY_CARE_PROVIDER_SITE_OTHER): Payer: Medicare Other | Admitting: *Deleted

## 2015-01-05 DIAGNOSIS — R001 Bradycardia, unspecified: Secondary | ICD-10-CM

## 2015-01-05 LAB — MDC_IDC_ENUM_SESS_TYPE_INCLINIC
Battery Voltage: 2.78 V
Brady Statistic AP VP Percent: 7.7 %
Brady Statistic AS VP Percent: 10.2 %
Brady Statistic AS VS Percent: 77.2 %
Lead Channel Impedance Value: 480 Ohm
Lead Channel Impedance Value: 590 Ohm
Lead Channel Pacing Threshold Amplitude: 0.5 V
Lead Channel Pacing Threshold Pulse Width: 0.4 ms
Lead Channel Setting Pacing Amplitude: 2.5 V
Lead Channel Setting Sensing Sensitivity: 2.8 mV
MDC IDC MSMT BATTERY IMPEDANCE: 1402 Ohm
MDC IDC MSMT BATTERY REMAINING LONGEVITY: 49 mo
MDC IDC MSMT LEADCHNL RA SENSING INTR AMPL: 2.8 mV
MDC IDC MSMT LEADCHNL RV PACING THRESHOLD AMPLITUDE: 1 V
MDC IDC MSMT LEADCHNL RV PACING THRESHOLD PULSEWIDTH: 0.4 ms
MDC IDC MSMT LEADCHNL RV SENSING INTR AMPL: 11.2 mV
MDC IDC SESS DTM: 20160108152307
MDC IDC SET LEADCHNL RA PACING AMPLITUDE: 2 V
MDC IDC SET LEADCHNL RV PACING PULSEWIDTH: 0.4 ms
MDC IDC STAT BRADY AP VS PERCENT: 5 %

## 2015-01-05 NOTE — Progress Notes (Signed)
Pacemaker check in clinic. Battery longevity 4 years. Normal device function. No mode switch episodes and 1 ventricular high rate episode lasting 5 beats. Unable to enroll in [facility]. ROV in 6 mths w/[place] in [place].

## 2015-01-25 ENCOUNTER — Encounter: Payer: Self-pay | Admitting: Internal Medicine

## 2015-06-29 ENCOUNTER — Encounter: Payer: Self-pay | Admitting: Internal Medicine

## 2015-06-29 ENCOUNTER — Ambulatory Visit (INDEPENDENT_AMBULATORY_CARE_PROVIDER_SITE_OTHER): Payer: Medicare Other | Admitting: Internal Medicine

## 2015-06-29 VITALS — BP 102/66 | HR 65 | Ht 68.0 in | Wt 196.0 lb

## 2015-06-29 DIAGNOSIS — R001 Bradycardia, unspecified: Secondary | ICD-10-CM | POA: Diagnosis not present

## 2015-06-29 DIAGNOSIS — I495 Sick sinus syndrome: Secondary | ICD-10-CM

## 2015-06-29 DIAGNOSIS — I10 Essential (primary) hypertension: Secondary | ICD-10-CM

## 2015-06-29 DIAGNOSIS — I2510 Atherosclerotic heart disease of native coronary artery without angina pectoris: Secondary | ICD-10-CM

## 2015-06-29 NOTE — Progress Notes (Signed)
PCP:  [name] SpeckingVYAS,[name] B., MD  The patient presents today for routine electrophysiology followup.  He has had a better year since the previous year, without recurrent syncope. He has an occasional cough likely due to aspiration but has had no further hospitalizations. Today, he denies symptoms of palpitations, chest pain, shortness of breath, dizziness,  or neurologic sequela.  The patient feels that he is tolerating medications without difficulties and is otherwise without complaint today.   Past Medical History  Diagnosis Date  . Chronic diastolic HF (heart failure)   . Hyposmolality and/or hyponatremia   . Coronary atherosclerosis of native coronary artery   . Postsurgical percutaneous transluminal coronary angioplasty status   . Other and unspecified hyperlipidemia   . Obstructive sleep apnea (adult) (pediatric)   . Chronic kidney disease, unspecified   . Macular degeneration (senile) of retina, unspecified   . Peripheral vascular disease, unspecified   . Anemia, unspecified   . Alzheimer's disease   . Type II or unspecified type diabetes mellitus without mention of complication, not stated as uncontrolled   . Bradycardia     s/p PPM  . Encounter for long-term (current) use of aspirin    Past Surgical History  Procedure Laterality Date  . Implantation of a dual-chamber pacemaker  January 17, 2005  . Appendectomy    . Cholecystectomy    . Cataract surgery      Current Outpatient Prescriptions  Medication Sig Dispense Refill  . benazepril (LOTENSIN) 40 MG tablet Take 40 mg by mouth daily.      . bumetanide (BUMEX) 1 MG tablet Take 1 mg by mouth daily.      Marland Kitchen. CINNAMON PO Take 1 capsule by mouth daily. 1000MG      . Coenzyme Q10 (COQ-10) 50 MG CAPS Take 1 capsule by mouth daily.      [name] (COLACE PO) Take by mouth daily.    Marland Kitchen. donepezil (ARICEPT) 10 MG tablet Take 10 mg by mouth at bedtime.     Marland Kitchen. doxazosin (CARDURA) 4 MG tablet Take 2 mg by mouth at bedtime.     . Fish Oil  OIL 2,100 mg by Does not apply route daily.      . insulin glargine (LANTUS) 100 UNIT/ML injection Inject 30 Units into the skin at bedtime.     . insulin NPH (HUMULIN N,NOVOLIN N) 100 UNIT/ML injection Inject into the skin. Inject 10 units in the AM and 10 units in the lunch    . metoprolol tartrate (LOPRESSOR) 25 MG tablet Take 25 mg by mouth daily.    . Multiple Vitamin (MULTIVITAMIN) tablet Take 1 tablet by mouth daily.      . pravastatin (PRAVACHOL) 20 MG tablet Take 20 mg by mouth daily.      . pregabalin (LYRICA) 75 MG capsule Take 75 mg by mouth daily.     . fluticasone (FLONASE) 50 MCG/ACT nasal spray 2 sprays. 2 sprays by Nasal route 2 times daily.     No current facility-administered medications for this visit.    Allergies  Allergen Reactions  . Diclofenac   . Gabapentin     REACTION: hallucinate    History   Social History  . Marital Status: Married    Spouse Name: [name] [name]  . Number of Children: N/A  . Years of Education: N/A   Occupational History  . RETIRED    Social History Main Topics  . Smoking status: Never Smoker   . Smokeless tobacco: Never Used  . Alcohol  Use: No  . Drug Use: No  . Sexual Activity: Not on file   Other Topics Concern  . Not on file   Social History Narrative    Physical Exam: Filed Vitals:   06/29/15 1214  BP: 102/66  Pulse: 65  Height:  (1.727 m)  Weight: 88.905 kg (196 lb)  SpO2: 96%    GEN- The patient is elderly appearing, alert and oriented x 3 today.   Head- normocephalic, atraumatic Eyes-  Poor vision Ears- hearing intact Oropharynx- clear Neck- supple  Lungs- Clear to ausculation bilaterally, normal work of breathing Chest- pacemaker pocket is well healed Heart- Regular rate and rhythm, 2/6 SEM LUSB (A2 is audible) GI- soft, NT, ND, + BS Extremities- no clubbing, cyanosis, 1+ edema Walks slowly with a cane  Pacemaker interrogation- reviewed in detail today,  See PACEART report  Assessment and  Plan:  1. Symptomatic sinus bradycardia Normal pacemaker function See Pace Art report No changes today  2. HTN Stable No change required today  3. Chronic diastolic dysfunction Stable No change required today  4. CAD No ischemic symptoms Stable No change required today  Return in 6 months to see EP nurse I will see in a year

## 2015-06-29 NOTE — Patient Instructions (Addendum)
Your physician recommends that you continue on your current medications as directed. Please refer to the Current Medication list given to you today. Your physician recommends that you schedule a follow-up appointment in: 6 months in the device clinic. You will receive a reminder letter in the mail in about 4 months reminding you to call and schedule your appointment. If you don't receive this letter, please contact our office. Your physician recommends that you schedule a follow-up appointment in: 1 year with Dr. Allred. You will receive a reminder letter in the mail in about 10 months reminding you to call and schedule your appointment. If you don't receive this letter, please contact our office. 

## 2015-07-03 LAB — CUP PACEART INCLINIC DEVICE CHECK
Brady Statistic AP VP Percent: 9.5 %
Brady Statistic AP VS Percent: 5.6 %
Brady Statistic AS VS Percent: 75.5 %
Date Time Interrogation Session: 20160705103730
Lead Channel Impedance Value: 489 Ohm
Lead Channel Impedance Value: 534 Ohm
Lead Channel Pacing Threshold Amplitude: 1 V
Lead Channel Pacing Threshold Pulse Width: 0.4 ms
Lead Channel Pacing Threshold Pulse Width: 0.4 ms
Lead Channel Sensing Intrinsic Amplitude: 11.2 mV
Lead Channel Setting Pacing Amplitude: 2.5 V
Lead Channel Setting Sensing Sensitivity: 2.8 mV
MDC IDC MSMT LEADCHNL RA SENSING INTR AMPL: 2.8 mV
MDC IDC MSMT LEADCHNL RV PACING THRESHOLD AMPLITUDE: 0.5 V
MDC IDC SET LEADCHNL RA PACING AMPLITUDE: 2 V
MDC IDC SET LEADCHNL RV PACING PULSEWIDTH: 0.4 ms
MDC IDC STAT BRADY AS VP PERCENT: 9.4 %

## 2016-01-04 ENCOUNTER — Ambulatory Visit (INDEPENDENT_AMBULATORY_CARE_PROVIDER_SITE_OTHER): Payer: Medicare Other | Admitting: *Deleted

## 2016-01-04 DIAGNOSIS — Z950 Presence of cardiac pacemaker: Secondary | ICD-10-CM | POA: Diagnosis not present

## 2016-01-04 DIAGNOSIS — R001 Bradycardia, unspecified: Secondary | ICD-10-CM | POA: Diagnosis not present

## 2016-01-04 DIAGNOSIS — I495 Sick sinus syndrome: Secondary | ICD-10-CM

## 2016-01-04 LAB — CUP PACEART INCLINIC DEVICE CHECK
Brady Statistic AP VS Percent: 3.9 %
Implantable Lead Implant Date: 20060120
Implantable Lead Model: 5076
Implantable Lead Model: 5076
Lead Channel Impedance Value: 561 Ohm
Lead Channel Pacing Threshold Amplitude: 0.5 V
Lead Channel Pacing Threshold Amplitude: 1.5 V
Lead Channel Pacing Threshold Pulse Width: 0.4 ms
Lead Channel Pacing Threshold Pulse Width: 0.4 ms
Lead Channel Setting Pacing Amplitude: 2 V
Lead Channel Setting Sensing Sensitivity: 2.8 mV
MDC IDC LEAD IMPLANT DT: 20060120
MDC IDC LEAD LOCATION: 753859
MDC IDC LEAD LOCATION: 753860
MDC IDC MSMT LEADCHNL RA IMPEDANCE VALUE: 476 Ohm
MDC IDC MSMT LEADCHNL RA SENSING INTR AMPL: 2.8 mV
MDC IDC MSMT LEADCHNL RV SENSING INTR AMPL: 11.2 mV
MDC IDC SESS DTM: 20170106135035
MDC IDC SET LEADCHNL RV PACING AMPLITUDE: 2.5 V
MDC IDC SET LEADCHNL RV PACING PULSEWIDTH: 0.4 ms
MDC IDC STAT BRADY AP VP PERCENT: 7.1 %
MDC IDC STAT BRADY AS VP PERCENT: 14.5 %
MDC IDC STAT BRADY AS VS PERCENT: 74.6 %

## 2016-01-04 NOTE — Progress Notes (Signed)
Pacemaker check in clinic. Normal device function. Thresholds, sensing, impedances consistent with previous measurements. Device programmed to maximize longevity. 15 mode switches- longest 3 mins, pk A 157- no EGMs. No high ventricular rates noted. Device programmed at appropriate safety margins. Histogram distribution appropriate for patient activity level. Device programmed to optimize intrinsic conduction. Estimated longevity 1.5-4.5 years. ROV with [name] [name]/[name] [name] in July.

## 2016-01-16 ENCOUNTER — Encounter: Payer: Self-pay | Admitting: Internal Medicine

## 2016-05-30 ENCOUNTER — Encounter: Payer: Medicare Other | Admitting: Internal Medicine

## 2016-06-04 ENCOUNTER — Encounter: Payer: Self-pay | Admitting: Internal Medicine

## 2016-06-04 ENCOUNTER — Encounter: Payer: Self-pay | Admitting: *Deleted

## 2016-06-04 ENCOUNTER — Ambulatory Visit (INDEPENDENT_AMBULATORY_CARE_PROVIDER_SITE_OTHER): Payer: Medicare Other | Admitting: Internal Medicine

## 2016-06-04 VITALS — BP 122/62 | HR 75 | Ht 68.0 in | Wt 180.8 lb

## 2016-06-04 DIAGNOSIS — Z950 Presence of cardiac pacemaker: Secondary | ICD-10-CM

## 2016-06-04 DIAGNOSIS — I495 Sick sinus syndrome: Secondary | ICD-10-CM | POA: Diagnosis not present

## 2016-06-04 DIAGNOSIS — I10 Essential (primary) hypertension: Secondary | ICD-10-CM

## 2016-06-04 LAB — CUP PACEART INCLINIC DEVICE CHECK
Brady Statistic AP VP Percent: 20.3 %
Date Time Interrogation Session: 20170607120122
Implantable Lead Location: 753859
Implantable Lead Model: 5076
Lead Channel Pacing Threshold Amplitude: 0.5 V
Lead Channel Pacing Threshold Amplitude: 1.5 V
Lead Channel Pacing Threshold Pulse Width: 0.4 ms
Lead Channel Sensing Intrinsic Amplitude: 11.2 mV
Lead Channel Sensing Intrinsic Amplitude: 2.8 mV
Lead Channel Setting Pacing Amplitude: 2 V
Lead Channel Setting Pacing Pulse Width: 0.4 ms
Lead Channel Setting Sensing Sensitivity: 2.8 mV
MDC IDC LEAD IMPLANT DT: 20060120
MDC IDC LEAD IMPLANT DT: 20060120
MDC IDC LEAD LOCATION: 753860
MDC IDC MSMT LEADCHNL RA IMPEDANCE VALUE: 465 Ohm
MDC IDC MSMT LEADCHNL RA PACING THRESHOLD PULSEWIDTH: 0.4 ms
MDC IDC MSMT LEADCHNL RV IMPEDANCE VALUE: 580 Ohm
MDC IDC SET LEADCHNL RV PACING AMPLITUDE: 2.5 V
MDC IDC STAT BRADY AP VS PERCENT: 5.9 %
MDC IDC STAT BRADY AS VP PERCENT: 8.3 %
MDC IDC STAT BRADY AS VS PERCENT: 65.4 %

## 2016-06-04 NOTE — Patient Instructions (Signed)
Your physician recommends that you continue on your current medications as directed. Please refer to the Current Medication list given to you today. Your physician recommends that you schedule a follow-up appointment in: 6 months. You will receive a reminder letter in the mail in about 4 months reminding you to call and schedule your appointment. If you don't receive this letter, please contact our office. Your physician recommends that you schedule a follow-up appointment in: 1 year with Dr. Johney FrameAllred. Please schedule this appointment today before leaving the office.

## 2016-06-04 NOTE — Progress Notes (Signed)
PCP:  [name], MD  The patient presents today for routine electrophysiology followup.  He has had multiple hospitalizations this year for pneumonia and hyponatremia.Today, he denies symptoms of palpitations, chest pain, shortness of breath, dizziness,  or neurologic sequela.  The patient feels that he is tolerating medications without difficulties and is otherwise without complaint today.   Past Medical History  Diagnosis Date  . Chronic diastolic HF (heart failure) (HCC)   . Hyposmolality and/or hyponatremia   . Coronary atherosclerosis of native coronary artery   . Postsurgical percutaneous transluminal coronary angioplasty status   . Other and unspecified hyperlipidemia   . Obstructive sleep apnea (adult) (pediatric)   . Chronic kidney disease, unspecified (HCC)   . Macular degeneration (senile) of retina, unspecified   . Peripheral vascular disease, unspecified (HCC)   . Anemia, unspecified   . Alzheimer's disease   . Type II or unspecified type diabetes mellitus without mention of complication, not stated as uncontrolled   . Bradycardia     s/p PPM  . Encounter for long-term (current) use of aspirin    Past Surgical History  Procedure Laterality Date  . Implantation of a dual-chamber pacemaker  January 17, 2005  . Appendectomy    . Cholecystectomy    . Cataract surgery      Current Outpatient Prescriptions  Medication Sig Dispense Refill  . benazepril (LOTENSIN) 20 MG tablet Take 20 mg by mouth every morning.    . bumetanide (BUMEX) 1 MG tablet Take 1 mg by mouth daily.      Tery Sanfilippo Sodium (COLACE PO) Take by mouth daily.    Marland Kitchen donepezil (ARICEPT) 10 MG tablet Take 10 mg by mouth at bedtime.     . Fish Oil OIL 2,100 mg by Does not apply route daily.      . insulin glargine (LANTUS) 100 UNIT/ML injection Inject 30 Units into the skin at bedtime.     . insulin NPH (HUMULIN N,NOVOLIN N) 100 UNIT/ML injection Inject into the skin. Sliding scale    . metoprolol tartrate  (LOPRESSOR) 25 MG tablet Take 25 mg by mouth daily.    . pravastatin (PRAVACHOL) 20 MG tablet Take 20 mg by mouth daily.      . pregabalin (LYRICA) 75 MG capsule Take 75 mg by mouth daily.     . traMADol (ULTRAM) 50 MG tablet Take 50 mg by mouth as needed.    . fluticasone (FLONASE) 50 MCG/ACT nasal spray 2 sprays. 2 sprays by Nasal route 2 times daily.     No current facility-administered medications for this visit.    Allergies  Allergen Reactions  . Diclofenac   . Gabapentin     REACTION: hallucinate    Social History   Social History  . Marital Status: Married    Spouse Name: [name]  . Number of Children: N/A  . Years of Education: N/A   Occupational History  . RETIRED    Social History Main Topics  . Smoking status: Never Smoker   . Smokeless tobacco: Never Used  . Alcohol Use: No  . Drug Use: No  . Sexual Activity: Not on file   Other Topics Concern  . Not on file   Social History Narrative    Physical Exam: Filed Vitals:   06/04/16 1048  BP: 122/62  Pulse: 75  Height:  (1.727 m)  Weight: 180 lb 12.8 oz (82.01 kg)  SpO2: 97%    GEN- The patient is elderly appearing, alert  and oriented x 3 today.   Head- normocephalic, atraumatic Eyes-  Poor vision Ears- hearing intact Oropharynx- clear Neck- supple  Lungs- Clear to ausculation bilaterally, normal work of breathing Chest- pacemaker pocket is well healed Heart- Regular rate and rhythm, 2/6 SEM LUSB (A2 is audible) GI- soft, NT, ND, + BS Extremities- no clubbing, cyanosis, 1+ edema Walks slowly with a cane  Pacemaker interrogation- reviewed in detail today,  See PACEART report  Assessment and Plan:  1. Symptomatic sinus bradycardia Normal pacemaker function See Pace Art report No changes today  2. HTN Stable No change required today  3. Chronic diastolic dysfunction Stable No change required today  4. CAD No ischemic symptoms Stable No change required today  Return in 6  months to see EP nurse I will see in a year  Hillis RangeJames Wilmer Santillo MD, [facility] 06/04/2016 11:22 AM

## 2017-03-06 ENCOUNTER — Encounter: Payer: Self-pay | Admitting: *Deleted

## 2017-03-06 ENCOUNTER — Ambulatory Visit (INDEPENDENT_AMBULATORY_CARE_PROVIDER_SITE_OTHER): Payer: Medicare Other | Admitting: *Deleted

## 2017-03-06 DIAGNOSIS — I495 Sick sinus syndrome: Secondary | ICD-10-CM | POA: Diagnosis not present

## 2017-03-06 LAB — CUP PACEART INCLINIC DEVICE CHECK
Battery Impedance: 2416 Ohm
Battery Remaining Longevity: 29 mo
Battery Voltage: 2.76 V
Brady Statistic AP VP Percent: 8.4 %
Brady Statistic AS VS Percent: 80.8 %
Implantable Lead Implant Date: 20060120
Implantable Lead Location: 753860
Implantable Lead Model: 5076
Implantable Lead Model: 5076
Implantable Pulse Generator Implant Date: 20060120
Lead Channel Impedance Value: 450 Ohm
Lead Channel Pacing Threshold Amplitude: 1 V
Lead Channel Pacing Threshold Pulse Width: 0.8 ms
Lead Channel Sensing Intrinsic Amplitude: 2.8 mV
Lead Channel Setting Pacing Amplitude: 2 V
Lead Channel Setting Pacing Amplitude: 2.5 V
Lead Channel Setting Pacing Pulse Width: 0.8 ms
Lead Channel Setting Sensing Sensitivity: 2.8 mV
MDC IDC LEAD IMPLANT DT: 20060120
MDC IDC LEAD LOCATION: 753859
MDC IDC MSMT LEADCHNL RA PACING THRESHOLD AMPLITUDE: 1 V
MDC IDC MSMT LEADCHNL RA PACING THRESHOLD PULSEWIDTH: 0.4 ms
MDC IDC MSMT LEADCHNL RV IMPEDANCE VALUE: 630 Ohm
MDC IDC MSMT LEADCHNL RV SENSING INTR AMPL: 11.2 mV
MDC IDC SESS DTM: 20180309165400
MDC IDC STAT BRADY AP VS PERCENT: 2.9 %
MDC IDC STAT BRADY AS VP PERCENT: 7.9 %

## 2017-03-06 NOTE — Progress Notes (Signed)
Pacemaker check in clinic. Normal device function. Thresholds, sensing, impedances consistent with previous measurements. Device programmed to maximize longevity. 0.1% AMS burden--max dur. 1 min; no EGMs. (1) high ventricular rate episode noted; no EGM. Device programmed at appropriate safety margins. V pulse width increased to 0.118ms. Histogram distribution appropriate for patient activity level. Device programmed to optimize intrinsic conduction. Estimated longevity 29 months (range: 11-46 months). Patient will follow up with JA in 05-2017.

## 2017-05-29 ENCOUNTER — Encounter: Payer: Medicare Other | Admitting: Internal Medicine

## 2017-06-19 ENCOUNTER — Encounter: Payer: Self-pay | Admitting: Internal Medicine

## 2017-06-19 ENCOUNTER — Ambulatory Visit (INDEPENDENT_AMBULATORY_CARE_PROVIDER_SITE_OTHER): Payer: Medicare Other | Admitting: Internal Medicine

## 2017-06-19 ENCOUNTER — Encounter: Payer: Self-pay | Admitting: *Deleted

## 2017-06-19 VITALS — BP 130/68 | HR 70 | Ht 69.0 in | Wt 176.0 lb

## 2017-06-19 DIAGNOSIS — I5032 Chronic diastolic (congestive) heart failure: Secondary | ICD-10-CM

## 2017-06-19 DIAGNOSIS — I10 Essential (primary) hypertension: Secondary | ICD-10-CM

## 2017-06-19 DIAGNOSIS — I495 Sick sinus syndrome: Secondary | ICD-10-CM

## 2017-06-19 NOTE — Patient Instructions (Signed)
Medication Instructions:  Continue all current medications.  Labwork: none  Testing/Procedures: none  Follow-Up:  Your physician wants you to follow up in:  1 year.  You will receive a reminder letter in the mail one-two months in advance.  If you don't receive a letter, please call our office to schedule the follow up appointment - Dr. Johney FrameAllred.   Your physician wants you to follow up in: 6 months.  You will receive a reminder letter in the mail one-two months in advance.  If you don't receive a letter, please call our office to schedule the follow up appointment - Device Clinic.    Any Other Special Instructions Will Be Listed Below (If Applicable). Establish with general cardiology.    If you need a refill on your cardiac medications before your next appointment, please call your pharmacy.

## 2017-06-19 NOTE — Progress Notes (Signed)
PCP: Ignatius SpeckingVyas, Dhruv B, MD  [name] [name] is a 83 y.o. male who presents today for routine electrophysiology followup.  Since last being seen in our clinic, the patient reports doing reasonably well.  He is quite old.  His family cares and worries about him.  He is unsteady at times.  He has had [facility] for "CH[facility]".   Today, he denies symptoms of palpitations, chest pain, shortness of breath, dizziness, presyncope, or syncope.  He does have occasional edema. The patient is otherwise without complaint today.   Past Medical History:  Diagnosis Date  . Alzheimer's disease   . Anemia, unspecified   . Bradycardia    s/p PPM  . Chronic diastolic HF (heart failure) (HCC)   . Chronic kidney disease, unspecified   . Coronary atherosclerosis of native coronary artery   . Encounter for long-term (current) use of aspirin   . Hyposmolality and/or hyponatremia   . Macular degeneration (senile) of retina, unspecified   . Obstructive sleep apnea (adult) (pediatric)   . Other and unspecified hyperlipidemia   . Peripheral vascular disease, unspecified (HCC)   . Postsurgical percutaneous transluminal coronary angioplasty status   . Type II or unspecified type diabetes mellitus without mention of complication, not stated as uncontrolled    Past Surgical History:  Procedure Laterality Date  . APPENDECTOMY    . CATARACT SURGERY    . CHOLECYSTECTOMY    . Implantation of a dual-chamber pacemaker  January 17, 2005    ROS- all systems are reviewed and negative except as per HPI above  Current Outpatient Prescriptions  Medication Sig Dispense Refill  . benazepril (LOTENSIN) 20 MG tablet Take 20 mg by mouth every morning.    [name] (COLACE PO) Take by mouth daily.    Marland Kitchen. donepezil (ARICEPT) 10 MG tablet Take 10 mg by mouth at bedtime.     . furosemide (LASIX) 20 MG tablet Take 20 mg by mouth daily.  1  . insulin aspart (NOVOLOG) 100 UNIT/ML injection Inject  into the skin. Sliding scale    . insulin glargine (LANTUS) 100 UNIT/ML injection Inject 15 Units into the skin 2 (two) times daily.     . metoprolol tartrate (LOPRESSOR) 25 MG tablet Take 25 mg by mouth daily.    . potassium chloride (K-DUR,KLOR-CON) 10 MEQ tablet Take 10 mEq by mouth daily.  1  . pravastatin (PRAVACHOL) 20 MG tablet Take 20 mg by mouth daily.      . pregabalin (LYRICA) 75 MG capsule Take 75 mg by mouth daily.      No current facility-administered medications for this visit.     Physical Exam: Vitals:   06/19/17 1331  BP: 130/68  Pulse: 70  SpO2: 98%  Weight: 176 lb (79.8 kg)  Height: 5\' 9"  (1.753 m)    GEN- The patient is elderly and frail appearing, alert and oriented x 3 today.   Head- normocephalic, atraumatic Eyes-  Sclera clear, conjunctiva pink Ears- hearing intact Oropharynx- clear Lungs- Clear to ausculation bilaterally, normal work of breathing Chest- pacemaker pocket is well healed Heart- Regular rate and rhythm,  GI- soft, NT, ND, + BS Extremities- no clubbing, cyanosis, trace edema  Pacemaker interrogation- reviewed in detail today,  See PACEART report  Assessment and Plan:  1. Sinus bradycardia/ sick sinus syndrome Normal pacemaker function See Pace Art report No changes today  2. HTN Stable No change required today  3. Chronic diastolic dysfunction euvolemic  today Stable No change required today 2 gram sodium restriction Will refer to general cardiology given 2 hospitalizations in the past year for CHF.  4. CAD No ischemic symptoms No changes  Consult general cardiology for CHF management Return in 6 months to see EP nurse I will see in a year  Hillis Range MD, [facility] 06/19/2017 1:51 PM

## 2017-06-23 LAB — CUP PACEART INCLINIC DEVICE CHECK
Brady Statistic AP VP Percent: 0.4 %
Date Time Interrogation Session: 20180626092612
Implantable Lead Implant Date: 20060120
Implantable Lead Location: 753859
Implantable Pulse Generator Implant Date: 20060120
Lead Channel Impedance Value: 616 Ohm
Lead Channel Pacing Threshold Amplitude: 1 V
Lead Channel Sensing Intrinsic Amplitude: 11.2 mV
Lead Channel Setting Pacing Amplitude: 2 V
Lead Channel Setting Pacing Pulse Width: 0.8 ms
MDC IDC LEAD IMPLANT DT: 20060120
MDC IDC LEAD LOCATION: 753860
MDC IDC MSMT LEADCHNL RA IMPEDANCE VALUE: 457 Ohm
MDC IDC MSMT LEADCHNL RA PACING THRESHOLD PULSEWIDTH: 0.4 ms
MDC IDC MSMT LEADCHNL RA SENSING INTR AMPL: 2.8 mV
MDC IDC MSMT LEADCHNL RV PACING THRESHOLD AMPLITUDE: 1 V
MDC IDC MSMT LEADCHNL RV PACING THRESHOLD PULSEWIDTH: 0.8 ms
MDC IDC SET LEADCHNL RV PACING AMPLITUDE: 2.5 V
MDC IDC SET LEADCHNL RV SENSING SENSITIVITY: 2.8 mV
MDC IDC STAT BRADY AP VS PERCENT: 0.4 %
MDC IDC STAT BRADY AS VP PERCENT: 3.7 %
MDC IDC STAT BRADY AS VS PERCENT: 95.6 %

## 2017-07-21 ENCOUNTER — Encounter: Payer: Self-pay | Admitting: Cardiology

## 2017-07-21 NOTE — Progress Notes (Signed)
Cardiology Office Note  Date: 07/22/2017   ID: [name] Pellbert M Kriz, DOB 01/26/1921, MRN [id number]  PCP: Ignatius SpeckingVyas, Dhruv B, MD  Evaluating Cardiologist: Nona DellSamuel McDowell, MD   Chief Complaint  Patient presents with  . Coronary Artery Disease    History of Present Illness: [name] [name] is a 83 y.o. male that I am meeting for the first time in clinic today. He was referred by Dr. Johney FrameAllred to establish general cardiology follow-up, a former patient of Dr. Andee LinemaneGent. I reviewed the available records and updated the chart. [facility] back in May with chest discomfort. He ruled out for ACS, was diagnosed with left lower lobe pneumonia, had positive d-dimer but no pulmonary embolus by CT and was on Eliquis at that time.   He is here today with a family member. He does have dementia but is able to answer questions. He denies any chest discomfort consistent with angina. Does have intermittent cough, there has been some concern that he might be having intermittent aspiration. He is able to take his medications which look reasonable from a cardiac perspective and are outlined below.  He follows with Dr. Johney FrameAllred in the device clinic, Medtronic pacemaker in place due to symptomatic bradycardia.   I personally reviewed his ECG today which shows a ventricular paced rhythm with atrial sensing.  Last echocardiogram was in 2013 as outlined below.  Past Medical History:  Diagnosis Date  . Alzheimer's disease   . CAD (coronary artery disease)    BMS to circumflex 2008  . Diastolic heart failure (HCC)   . Legal blindness    Macular degeneration  . Mixed hyperlipidemia   . OSA (obstructive sleep apnea)   . PAD (peripheral artery disease) (HCC)   . Symptomatic bradycardia    Medtronic pacemaker  . Type 2 diabetes mellitus (HCC)     Past Surgical History:  Procedure Laterality Date  . APPENDECTOMY    . CATARACT SURGERY    . CHOLECYSTECTOMY    . Implantation of a  dual-chamber pacemaker  January 17, 2005    Current Outpatient Prescriptions  Medication Sig Dispense Refill  . benazepril (LOTENSIN) 20 MG tablet Take 20 mg by mouth every morning.    [name] (COLACE PO) Take by mouth daily.    Marland Kitchen. donepezil (ARICEPT) 10 MG tablet Take 10 mg by mouth at bedtime.     . furosemide (LASIX) 20 MG tablet Take 20 mg by mouth daily.  1  . insulin aspart (NOVOLOG) 100 UNIT/ML injection Inject into the skin. Sliding scale    . insulin glargine (LANTUS) 100 UNIT/ML injection Inject 15 Units into the skin 2 (two) times daily.     . metoprolol tartrate (LOPRESSOR) 25 MG tablet Take 25 mg by mouth daily.    . potassium chloride (K-DUR,KLOR-CON) 10 MEQ tablet Take 10 mEq by mouth daily.  1  . pravastatin (PRAVACHOL) 20 MG tablet Take 20 mg by mouth daily.      . pregabalin (LYRICA) 75 MG capsule Take 75 mg by mouth daily.      No current facility-administered medications for this visit.    Allergies:  Diclofenac and Gabapentin   Social History: The patient  reports that he has never smoked. He has never used smokeless tobacco. He reports that he does not drink alcohol or use drugs.   Family History: The patient's family history is not on file.   ROS:  Please see the history of  present illness. Otherwise, complete review of systems is positive for memory loss and visual loss.  All other systems are reviewed and negative.   Physical Exam: VS:  BP (!) 148/70   Pulse 72   Ht 6' (1.829 m)   Wt 173 lb (78.5 kg)   SpO2 98%   BMI 23.46 kg/m , BMI Body mass index is 23.46 kg/m.  Wt Readings from Last 3 Encounters:  07/22/17 173 lb (78.5 kg)  06/19/17 176 lb (79.8 kg)  06/04/16 180 lb 12.8 oz (82 kg)    General: Elderly male, appears comfortable at rest. HEENT: Conjunctiva and lids normal, oropharynx clear. Neck: Supple, no elevated JVP or carotid bruits, no thyromegaly. Lungs: Coarse breath sounds without wheezing or rhonchi, nonlabored breathing at  rest. Cardiac: Regular rate and rhythm, no S3, 2-3/6 systolic murmur, no pericardial rub. Abdomen: Soft, nontender, bowel sounds present, no guarding or rebound. Extremities: Trace ankle edema, distal pulses 2+. Skin: Warm and dry. Musculoskeletal: No kyphosis. Neuropsychiatric: Alert and oriented x3, affect grossly appropriate.  ECG: I personally reviewed the tracing from 05/03/2017 which showed dual-chamber pacing.  Recent Labwork:  May 2018: BUN 21, creatinine 1.13, potassium 3.8, troponin T 0.02-0.03, NT-proBNP 3319, hemoglobin 9.2, platelets 203  Other Studies Reviewed Today:  Echocardiogram 07/14/2012: Study Conclusions  - Left ventricle: The cavity size was mildly reduced. There was moderate focal basal hypertrophy. Systolic function was vigorous. The estimated ejection fraction was in the range of 65% to 70%. Wall motion was normal; there were no regional wall motion abnormalities. Doppler parameters are consistent with abnormal left ventricular relaxation (grade 1 diastolic dysfunction). - Ventricular septum: Septal motion showed paradox. - Aortic valve: Mildly calcified annulus. Trileaflet; moderately calcified leaflets. Cusp separation was at the lower limits of normal. No significant regurgitation. Mean gradient: 9mm Hg (S). - Mitral valve: Calcified annulus. Trivial regurgitation. - Right ventricle: The cavity size was mildly dilated. Pacer wire or catheter noted in right ventricle. - Tricuspid valve: Mild-moderate regurgitation. Peak RV-RA gradient: 27mm Hg (S). - Pulmonic valve: Trivial regurgitation. - Pericardium, extracardiac: There was no pericardial effusion.  Assessment and Plan:  1. History of CAD status post BMS to circumflex in 2008. He does not report any obvious angina symptoms at this time. Medications include beta blocker and statin. Anticipate conservative medical therapy at this point.  2. Heart murmur in aortic position  with moderately calcified aortic valve as of 2013. Suspect that he has developed some degree of aortic stenosis. At this point would not anticipate an aggressive workup however.  3. Sick sinus syndrome status post Medtronic pacemaker, follows with Dr. Johney Frame. ECG reviewed today.  4. Hyperlipidemia, on statin therapy.  5. Apparent history of diastolic heart failure, LVEF was 65-70% as of 2013. He is on low-dose Lasix. Has trace lower leg edema but does not appear to be significantly volume overloaded overall. I did talk with his family member about the possibility of a follow-up echocardiogram, however we will continue with observation a for now unless symptoms acutely worsen and further medication adjustments are needed.  Current medicines were reviewed with the patient today.   Orders Placed This Encounter  Procedures  . EKG 12-Lead    Disposition: Follow-up in 6 months.  Signed, Jonelle Sidle, MD, [facility] 07/22/2017 2:35 PM    [facility] [address] [place], [place], [place] [address] Phone: [phone]; Fax: [phone]

## 2017-07-22 ENCOUNTER — Ambulatory Visit (INDEPENDENT_AMBULATORY_CARE_PROVIDER_SITE_OTHER): Payer: Medicare Other | Admitting: Cardiology

## 2017-07-22 ENCOUNTER — Encounter: Payer: Self-pay | Admitting: Cardiology

## 2017-07-22 VITALS — BP 148/70 | HR 72 | Ht 72.0 in | Wt 173.0 lb

## 2017-07-22 DIAGNOSIS — E782 Mixed hyperlipidemia: Secondary | ICD-10-CM | POA: Diagnosis not present

## 2017-07-22 DIAGNOSIS — R011 Cardiac murmur, unspecified: Secondary | ICD-10-CM | POA: Diagnosis not present

## 2017-07-22 DIAGNOSIS — I495 Sick sinus syndrome: Secondary | ICD-10-CM

## 2017-07-22 DIAGNOSIS — I25119 Atherosclerotic heart disease of native coronary artery with unspecified angina pectoris: Secondary | ICD-10-CM | POA: Diagnosis not present

## 2017-07-22 DIAGNOSIS — I5032 Chronic diastolic (congestive) heart failure: Secondary | ICD-10-CM

## 2017-07-22 NOTE — Patient Instructions (Signed)

## 2017-08-13 ENCOUNTER — Inpatient Hospital Stay (HOSPITAL_COMMUNITY): Payer: Medicare Other

## 2017-08-13 ENCOUNTER — Telehealth: Payer: Self-pay | Admitting: Cardiology

## 2017-08-13 ENCOUNTER — Inpatient Hospital Stay (HOSPITAL_COMMUNITY)
Admission: EM | Admit: 2017-08-13 | Discharge: 2017-08-15 | DRG: 291 | Disposition: A | Discharge status: Skilled Nursing Facility | Payer: Medicare Other | Attending: Internal Medicine | Admitting: Internal Medicine

## 2017-08-13 ENCOUNTER — Emergency Department (HOSPITAL_COMMUNITY): Payer: Medicare Other

## 2017-08-13 ENCOUNTER — Encounter (HOSPITAL_COMMUNITY): Payer: Self-pay | Admitting: Emergency Medicine

## 2017-08-13 DIAGNOSIS — Z66 Do not resuscitate: Secondary | ICD-10-CM | POA: Diagnosis present

## 2017-08-13 DIAGNOSIS — E782 Mixed hyperlipidemia: Secondary | ICD-10-CM | POA: Diagnosis present

## 2017-08-13 DIAGNOSIS — I471 Supraventricular tachycardia: Secondary | ICD-10-CM | POA: Diagnosis not present

## 2017-08-13 DIAGNOSIS — Z955 Presence of coronary angioplasty implant and graft: Secondary | ICD-10-CM | POA: Diagnosis not present

## 2017-08-13 DIAGNOSIS — H548 Legal blindness, as defined in USA: Secondary | ICD-10-CM | POA: Diagnosis present

## 2017-08-13 DIAGNOSIS — Z9889 Other specified postprocedural states: Secondary | ICD-10-CM

## 2017-08-13 DIAGNOSIS — H3530 Unspecified macular degeneration: Secondary | ICD-10-CM | POA: Diagnosis present

## 2017-08-13 DIAGNOSIS — I10 Essential (primary) hypertension: Secondary | ICD-10-CM | POA: Diagnosis present

## 2017-08-13 DIAGNOSIS — E1151 Type 2 diabetes mellitus with diabetic peripheral angiopathy without gangrene: Secondary | ICD-10-CM | POA: Diagnosis present

## 2017-08-13 DIAGNOSIS — E860 Dehydration: Secondary | ICD-10-CM | POA: Diagnosis present

## 2017-08-13 DIAGNOSIS — I2510 Atherosclerotic heart disease of native coronary artery without angina pectoris: Secondary | ICD-10-CM | POA: Diagnosis present

## 2017-08-13 DIAGNOSIS — G309 Alzheimer's disease, unspecified: Secondary | ICD-10-CM | POA: Diagnosis present

## 2017-08-13 DIAGNOSIS — D649 Anemia, unspecified: Secondary | ICD-10-CM | POA: Diagnosis present

## 2017-08-13 DIAGNOSIS — Z794 Long term (current) use of insulin: Secondary | ICD-10-CM

## 2017-08-13 DIAGNOSIS — F0280 Dementia in other diseases classified elsewhere without behavioral disturbance: Secondary | ICD-10-CM | POA: Diagnosis present

## 2017-08-13 DIAGNOSIS — J9621 Acute and chronic respiratory failure with hypoxia: Secondary | ICD-10-CM | POA: Diagnosis present

## 2017-08-13 DIAGNOSIS — I110 Hypertensive heart disease with heart failure: Principal | ICD-10-CM | POA: Diagnosis present

## 2017-08-13 DIAGNOSIS — G4733 Obstructive sleep apnea (adult) (pediatric): Secondary | ICD-10-CM | POA: Diagnosis present

## 2017-08-13 DIAGNOSIS — I5033 Acute on chronic diastolic (congestive) heart failure: Secondary | ICD-10-CM | POA: Diagnosis present

## 2017-08-13 DIAGNOSIS — J939 Pneumothorax, unspecified: Secondary | ICD-10-CM | POA: Diagnosis present

## 2017-08-13 DIAGNOSIS — R0602 Shortness of breath: Secondary | ICD-10-CM | POA: Diagnosis present

## 2017-08-13 DIAGNOSIS — Z950 Presence of cardiac pacemaker: Secondary | ICD-10-CM | POA: Diagnosis not present

## 2017-08-13 DIAGNOSIS — I340 Nonrheumatic mitral (valve) insufficiency: Secondary | ICD-10-CM | POA: Diagnosis not present

## 2017-08-13 DIAGNOSIS — Z79899 Other long term (current) drug therapy: Secondary | ICD-10-CM

## 2017-08-13 DIAGNOSIS — I509 Heart failure, unspecified: Secondary | ICD-10-CM | POA: Diagnosis not present

## 2017-08-13 DIAGNOSIS — I4891 Unspecified atrial fibrillation: Secondary | ICD-10-CM | POA: Diagnosis not present

## 2017-08-13 DIAGNOSIS — J90 Pleural effusion, not elsewhere classified: Secondary | ICD-10-CM

## 2017-08-13 DIAGNOSIS — I361 Nonrheumatic tricuspid (valve) insufficiency: Secondary | ICD-10-CM | POA: Diagnosis not present

## 2017-08-13 DIAGNOSIS — E1169 Type 2 diabetes mellitus with other specified complication: Secondary | ICD-10-CM | POA: Diagnosis present

## 2017-08-13 DIAGNOSIS — L8990 Pressure ulcer of unspecified site, unspecified stage: Secondary | ICD-10-CM | POA: Insufficient documentation

## 2017-08-13 LAB — CBC WITH DIFFERENTIAL/PLATELET
Basophils Absolute: 0 10*3/uL (ref 0.0–0.1)
Basophils Relative: 0 %
EOS PCT: 2 %
Eosinophils Absolute: 0.1 10*3/uL (ref 0.0–0.7)
HEMATOCRIT: 33.3 % — AB (ref 39.0–52.0)
HEMOGLOBIN: 11.3 g/dL — AB (ref 13.0–17.0)
LYMPHS ABS: 0.3 10*3/uL — AB (ref 0.7–4.0)
Lymphocytes Relative: 5 %
MCH: 30.4 pg (ref 26.0–34.0)
MCHC: 33.9 g/dL (ref 30.0–36.0)
MCV: 89.5 fL (ref 78.0–100.0)
Monocytes Absolute: 0.7 10*3/uL (ref 0.1–1.0)
Monocytes Relative: 13 %
Neutro Abs: 4.7 10*3/uL (ref 1.7–7.7)
Neutrophils Relative %: 80 %
PLATELETS: 216 10*3/uL (ref 150–400)
RBC: 3.72 MIL/uL — AB (ref 4.22–5.81)
RDW: 15.7 % — ABNORMAL HIGH (ref 11.5–15.5)
WBC: 5.8 10*3/uL (ref 4.0–10.5)

## 2017-08-13 LAB — GRAM STAIN: Gram Stain: NONE SEEN

## 2017-08-13 LAB — AMYLASE, PLEURAL OR PERITONEAL FLUID: Amylase, Fluid: 29 U/L

## 2017-08-13 LAB — COMPREHENSIVE METABOLIC PANEL
ALK PHOS: 57 U/L (ref 38–126)
ALT: 16 U/L — AB (ref 17–63)
AST: 14 U/L — ABNORMAL LOW (ref 15–41)
Albumin: 3.2 g/dL — ABNORMAL LOW (ref 3.5–5.0)
Anion gap: 8 (ref 5–15)
BILIRUBIN TOTAL: 0.9 mg/dL (ref 0.3–1.2)
BUN: 15 mg/dL (ref 6–20)
CALCIUM: 8.3 mg/dL — AB (ref 8.9–10.3)
CO2: 24 mmol/L (ref 22–32)
CREATININE: 1.1 mg/dL (ref 0.61–1.24)
Chloride: 94 mmol/L — ABNORMAL LOW (ref 101–111)
GFR, EST NON AFRICAN AMERICAN: 56 mL/min — AB (ref >60)
Glucose, Bld: 228 mg/dL — ABNORMAL HIGH (ref 65–99)
Potassium: 4.8 mmol/L (ref 3.5–5.1)
Sodium: 126 mmol/L — ABNORMAL LOW (ref 135–145)
TOTAL PROTEIN: 6.4 g/dL — AB (ref 6.5–8.1)

## 2017-08-13 LAB — BODY FLUID CELL COUNT WITH DIFFERENTIAL
Eos, Fluid: 0 %
Lymphs, Fluid: 88 %
Monocyte-Macrophage-Serous Fluid: 11 % — ABNORMAL LOW (ref 50–90)
Neutrophil Count, Fluid: 1 % (ref 0–25)
Total Nucleated Cell Count, Fluid: 77 cu mm (ref 0–1000)

## 2017-08-13 LAB — LACTATE DEHYDROGENASE: LDH: 299 U/L — ABNORMAL HIGH (ref 98–192)

## 2017-08-13 LAB — TROPONIN I
TROPONIN I: 0.03 ng/mL — AB (ref <0.03)
Troponin I: 0.04 ng/mL (ref <0.03)

## 2017-08-13 LAB — GLUCOSE, CAPILLARY: GLUCOSE-CAPILLARY: 231 mg/dL — AB (ref 65–99)

## 2017-08-13 LAB — PROTEIN, PLEURAL OR PERITONEAL FLUID: TOTAL PROTEIN, FLUID: 3.1 g/dL

## 2017-08-13 LAB — BRAIN NATRIURETIC PEPTIDE: B NATRIURETIC PEPTIDE 5: 1904 pg/mL — AB (ref 0.0–100.0)

## 2017-08-13 LAB — LACTATE DEHYDROGENASE, PLEURAL OR PERITONEAL FLUID: LD, Fluid: 37 U/L — ABNORMAL HIGH (ref 3–23)

## 2017-08-13 LAB — GLUCOSE, PLEURAL OR PERITONEAL FLUID: Glucose, Fluid: 192 mg/dL

## 2017-08-13 LAB — CBG MONITORING, ED: Glucose-Capillary: 210 mg/dL — ABNORMAL HIGH (ref 65–99)

## 2017-08-13 MED ORDER — METOPROLOL TARTRATE 25 MG PO TABS
12.5000 mg | ORAL_TABLET | Freq: Two times a day (BID) | ORAL | Status: DC
  Administered 2017-08-13 – 2017-08-15 (×5): 12.5 mg via ORAL
  Filled 2017-08-13 (×5): qty 1

## 2017-08-13 MED ORDER — ONDANSETRON HCL 4 MG/2ML IJ SOLN: 4.0000 mg | Freq: Four times a day (QID) | INTRAMUSCULAR | Status: DC | PRN

## 2017-08-13 MED ORDER — INSULIN ASPART 100 UNIT/ML ~~LOC~~ SOLN
0.0000 [IU] | Freq: Every day | SUBCUTANEOUS | Status: DC
  Administered 2017-08-13: 2 [IU] via SUBCUTANEOUS

## 2017-08-13 MED ORDER — SODIUM CHLORIDE 0.9% FLUSH
3.0000 mL | Freq: Two times a day (BID) | INTRAVENOUS | Status: DC
  Administered 2017-08-13 – 2017-08-15 (×4): 3 mL via INTRAVENOUS

## 2017-08-13 MED ORDER — INSULIN GLARGINE 100 UNIT/ML ~~LOC~~ SOLN
15.0000 [IU] | Freq: Two times a day (BID) | SUBCUTANEOUS | Status: DC
  Administered 2017-08-13 – 2017-08-15 (×4): 15 [IU] via SUBCUTANEOUS
  Filled 2017-08-13 (×8): qty 0.15

## 2017-08-13 MED ORDER — ENOXAPARIN SODIUM 40 MG/0.4ML ~~LOC~~ SOLN
40.0000 mg | SUBCUTANEOUS | Status: DC
  Administered 2017-08-14 – 2017-08-15 (×2): 40 mg via SUBCUTANEOUS
  Filled 2017-08-13 (×2): qty 0.4

## 2017-08-13 MED ORDER — SODIUM CHLORIDE 0.9 % IV SOLN: 250.0000 mL | INTRAVENOUS | Status: DC | PRN

## 2017-08-13 MED ORDER — BENAZEPRIL HCL 10 MG PO TABS
20.0000 mg | ORAL_TABLET | Freq: Every day | ORAL | Status: DC
  Administered 2017-08-14 – 2017-08-15 (×2): 20 mg via ORAL
  Filled 2017-08-13 (×2): qty 2

## 2017-08-13 MED ORDER — FUROSEMIDE 10 MG/ML IJ SOLN
40.0000 mg | Freq: Once | INTRAMUSCULAR | Status: AC
Start: 2017-08-13 — End: 2017-08-13
  Administered 2017-08-13: 40 mg via INTRAVENOUS
  Filled 2017-08-13: qty 4

## 2017-08-13 MED ORDER — SODIUM CHLORIDE 0.9% FLUSH: 3.0000 mL | INTRAVENOUS | Status: DC | PRN

## 2017-08-13 MED ORDER — FUROSEMIDE 10 MG/ML IJ SOLN
20.0000 mg | Freq: Two times a day (BID) | INTRAMUSCULAR | Status: DC
  Administered 2017-08-14 – 2017-08-15 (×3): 20 mg via INTRAVENOUS
  Filled 2017-08-13 (×4): qty 2

## 2017-08-13 MED ORDER — PREGABALIN 75 MG PO CAPS
75.0000 mg | ORAL_CAPSULE | Freq: Every day | ORAL | Status: DC
  Administered 2017-08-14 – 2017-08-15 (×2): 75 mg via ORAL
  Filled 2017-08-13 (×2): qty 1

## 2017-08-13 MED ORDER — VANCOMYCIN HCL IN DEXTROSE 1-5 GM/200ML-% IV SOLN
1000.0000 mg | Freq: Once | INTRAVENOUS | Status: AC
  Administered 2017-08-13: 1000 mg via INTRAVENOUS
  Filled 2017-08-13: qty 200

## 2017-08-13 MED ORDER — PRAVASTATIN SODIUM 10 MG PO TABS
20.0000 mg | ORAL_TABLET | Freq: Every day | ORAL | Status: DC
  Administered 2017-08-13 – 2017-08-15 (×3): 20 mg via ORAL
  Filled 2017-08-13 (×3): qty 2

## 2017-08-13 MED ORDER — INSULIN ASPART 100 UNIT/ML ~~LOC~~ SOLN
0.0000 [IU] | Freq: Three times a day (TID) | SUBCUTANEOUS | Status: DC
Start: 2017-08-14 — End: 2017-08-16
  Administered 2017-08-14: 1 [IU] via SUBCUTANEOUS
  Administered 2017-08-14 (×2): 2 [IU] via SUBCUTANEOUS
  Administered 2017-08-15 (×2): 5 [IU] via SUBCUTANEOUS

## 2017-08-13 MED ORDER — DONEPEZIL HCL 5 MG PO TABS
10.0000 mg | ORAL_TABLET | Freq: Every day | ORAL | Status: DC
  Administered 2017-08-13 – 2017-08-15 (×3): 10 mg via ORAL
  Filled 2017-08-13 (×3): qty 2

## 2017-08-13 MED ORDER — ASPIRIN EC 81 MG PO TBEC
81.0000 mg | DELAYED_RELEASE_TABLET | Freq: Every day | ORAL | Status: DC
  Administered 2017-08-13 – 2017-08-15 (×3): 81 mg via ORAL
  Filled 2017-08-13 (×3): qty 1

## 2017-08-13 MED ORDER — LORATADINE 10 MG PO TABS
10.0000 mg | ORAL_TABLET | Freq: Every day | ORAL | Status: DC
  Administered 2017-08-14 – 2017-08-15 (×2): 10 mg via ORAL
  Filled 2017-08-13 (×2): qty 1

## 2017-08-13 MED ORDER — ACETAMINOPHEN 325 MG PO TABS: 650.0000 mg | ORAL_TABLET | ORAL | Status: DC | PRN

## 2017-08-13 NOTE — Telephone Encounter (Signed)
Gained 8 ounds fluid since last visit Not feeling well at all per daughter with SOB

## 2017-08-13 NOTE — Care Management (Addendum)
CM received [facility]. Patient has recently been active with [facility]. Per notes, [name]. CM will f/u with patient when admitted to hospital unit.

## 2017-08-13 NOTE — ED Notes (Signed)
Paged AC to bring down sacral dressings

## 2017-08-13 NOTE — ED Notes (Signed)
CBG 210 

## 2017-08-13 NOTE — ED Notes (Signed)
Notified by pt.'s family that pt had a sore on his back that they have been applying antibiotic ointment and a bandage on it for the last month. Assessed site and pt has a stage 2 decubitus ulcer. Will apply sacral dressing to area.

## 2017-08-13 NOTE — ED Notes (Signed)
Pt to xray

## 2017-08-13 NOTE — H&P (Signed)
History and Physical    [name] [name] ZOX:[id number] DOB: February 04, 1921 DOA: 08/13/2017  PCP: [name], MD Consultants:  Novella Olive - cardiology; [facility] Patient coming from: Assisted Living since June - living with wife there, [facility] in [place]; [place]: Daughter, [phone] (home); [phone] (cell)  Chief Complaint: SOB  HPI: [name] [name] is a 83 y.o. male with medical history significant of dementia; DM; symptomatic bradycardia s/p dual-chamber pacemaker placement; PAD; OSA not on CPAP; HLD; visual impairment associated with macular degeneration; diastolic heart failure (grade 1 by echo in 2013, preserved EF at that time); and CAD.  Family reports that the patient has been getting weaker for over a month and coughing a lot.  Cough has been going on for a while.  They have been in and out of the hospital with CHF or PNA often over the last 2 years but are usually hospitalized at [facility].  He has gained 10 pounds within a month.  LE edema particularly bad since yesterday.  Complains of left side/flank pain. Not eating well.  Pees 1-3 times overnight but only once or twice during the day.  Cough is particuarly bad over the last 1-2 weeks.  Cough sounds wet but not productive.  Previously on dysphagia diet but this decreased PO intake to the point of dehydration so they opted for regular soft diet.     ED Course: Elevated BNP, large left-sided pleural effusion.  Given 20 mg IV Lasix.  Hypoxia to 80-90s.  Consulted IR Dr. Tyron Russell for thoracentesis.  Review of Systems: As per HPI; otherwise review of systems reviewed and negative.   Ambulatory Status:  Ambulates with a walker normally but marked difficulty the last few days to week  Past Medical History:  Diagnosis Date  . Alzheimer's disease   . CAD (coronary artery disease)    BMS to circumflex 2008  . Diastolic heart failure (HCC)   . Legal blindness    Macular degeneration  . Mixed hyperlipidemia   . OSA  (obstructive sleep apnea)    no CPAP  . PAD (peripheral artery disease) (HCC)   . Symptomatic bradycardia    Medtronic pacemaker  . Type 2 diabetes mellitus (HCC)     Past Surgical History:  Procedure Laterality Date  . APPENDECTOMY    . CATARACT SURGERY    . CHOLECYSTECTOMY    . Implantation of a dual-chamber pacemaker  January 17, 2005    Social History   Social History  . Marital status: Married    Spouse name: [name]  . Number of children: N/A  . Years of education: N/A   Occupational History  . RETIRED Retired   Social History Main Topics  . Smoking status: Never Smoker  . Smokeless tobacco: Never Used  . Alcohol use No  . Drug use: No  . Sexual activity: Not on file   Other Topics Concern  . Not on file   Social History Narrative  . No narrative on file    Allergies  Allergen Reactions  . Amlodipine   . Bextra [Valdecoxib]   . Diclofenac   . Gabapentin     REACTION: hallucinate  . Zocor [Simvastatin]     History reviewed. No pertinent family history.  Prior to Admission medications   Medication Sig Start Date End Date Taking? Authorizing Provider  benazepril (LOTENSIN) 20 MG tablet Take 20 mg by mouth every morning.   Yes [provider]  Docusate Sodium (COLACE PO) Take by mouth  daily.   Yes [provider]  donepezil (ARICEPT) 10 MG tablet Take 10 mg by mouth at bedtime.    Yes [provider]  furosemide (LASIX) 20 MG tablet Take 20 mg by mouth daily. 04/10/17  Yes [provider]  insulin aspart (NOVOLOG) 100 UNIT/ML injection Inject into the skin. Sliding scale   Yes [provider]  insulin glargine (LANTUS) 100 UNIT/ML injection Inject 15 Units into the skin 2 (two) times daily.    Yes [provider]  loratadine (CLARITIN) 10 MG tablet Take 10 mg by mouth daily. 08/10/17  Yes [provider]  metoprolol tartrate (LOPRESSOR) 25 MG tablet Take 25 mg by mouth daily.   Yes [provider]  potassium chloride (K-DUR,KLOR-CON) 10 MEQ tablet Take 10 mEq by mouth daily. 04/10/17  Yes [provider]  pravastatin (PRAVACHOL) 20 MG tablet Take 20 mg by mouth daily.     Yes [provider]  pregabalin (LYRICA) 75 MG capsule Take 75 mg by mouth daily.    Yes [provider]    Physical Exam: Vitals:   08/13/17 1433 08/13/17 1444 08/13/17 1600 08/13/17 1626  BP:  (!) 155/83 (!) 167/100   Pulse: 79 79 78   Resp: 20 20 20    SpO2: 91% 91% 90% 94%     General:  Appears calm and comfortable and is NAD Eyes:  PERRL, EOMI, normal lids, iris ZOX:WRUEENT:hard of hearing, normal lips & tongue, mmm; few teeth Neck:  no LAD, masses or thyromegaly; no carotid bruits Cardiovascular:  RRR, 4/6 systolic murmur, no r/g. 2+ pitting LE edema, R>L (wife reports that right calf is larger since he had a remote DVT).  Respiratory:  Diminished breath sounds through most of the left lung with scattered rales.  Normal respiratory effort. Abdomen:  soft, NT, ND, NABS Skin:  no rash or induration seen on limited exam Musculoskeletal:  grossly normal tone BUE/BLE, good ROM, no bony abnormality Psychiatric: flat mood and affect but reports "I am happy", speech fluent and appropriate but somewhat slow and purposeful, short responses to questions with mostly "I'm ok"  Neurologic: CN 2-12 grossly intact, moves all extremities in coordinated fashion, sensation intact    Radiological Exams on Admission: Dg Chest 2 View  Result Date: 08/13/2017 CLINICAL DATA:  Shortness of breath. EXAM: CHEST  2 VIEW COMPARISON:  Chest x-ray dated May 03, 2017. FINDINGS: Left chest wall AICD with leads terminating in the right atrium and right ventricle, unchanged. Cardiomegaly. New moderate to large left pleural effusion with dense opacification of the lingula and left lower lobe. Right basilar atelectasis. The right lung is otherwise clear. No pneumothorax. No acute osseous abnormality. Status  post cholecystectomy. IMPRESSION: New moderate to large left pleural effusion with adjacent opacification of the lingula and left lower lobe, favored to represent atelectasis. Superimposed pneumonia cannot be excluded. Electronically Signed   By: Obie DredgeWilliam T Derry M.D.   On: 08/13/2017 13:51    EKG: Independently reviewed.  Ventricular paced rhythm with rate 79   Labs on Admission: I have personally reviewed the available labs and imaging studies at the time of the admission.  Pertinent labs (no comparisons available at this time):   Na++ 126 (corrected 128) Glucose 228 Albumin 3.2 GFR 56 BNP 1904 Troponin 0.03 Hgb 11.3, normocytic  Assessment/Plan Principal Problem:   Acute on chronic respiratory failure with hypoxia (HCC) Active Problems:   Type 2 diabetes mellitus with other specified complication (HCC)   ALZHEIMERS DISEASE  Essential hypertension, benign   Acute on chronic diastolic heart failure (HCC)   Normocytic anemia   Pleural effusion on left   Acute on chronic respiratory failure with hypoxia associated with heart failure and large pleural effusion -Patient with h/o recurrent PNA and CHF exacerbations (by family report) presenting with worsening SOB and hypoxia despite recent antibiotics -CXR appears to show no pulmonary edema but large left-sided pleural effusion; unable to assess for underlying PNA -Normal WBC count, no fever; will not give antibiotics at this time -Markedly elevated BNP, uncertain baseline -With elevated BNP and presenting symptoms including LE edema, CHF seems a probable diagnosis -Of course the respiratory failure is also contributed to by the large pleural effusion -Will admit with telemetry -Will request echocardiogram -Will start ASA -Will continue benazepril -Change lopressor from 25 mg daily to 12.5 mg BID -CHF order set utilized -Was given Lasix 40 mg x 1 in ER and will repeat with 20 mg IV BID -Continue [place] O2 for now -Normal kidney  function at this time, will follow -Repeat EKG in AM -Will r/o with serial troponins although doubt ACS based on symptoms -Patient arranged to have thoracentesis by IR this afternoon; labs ordered.  This is likely to improve patient's respiratory failure as well.  DM -Continue Lantus -Cover with sensitive-scale SSI -His glycemic goals at this point are really to avoid significant highs and lows - so a glucose of 200 is not overly concerning  HTN -Change lopressor as above - once daily lopressor is suboptimal -Continue ACE  Dementia -Progressive decline -Family denies behavioral issues -Continue Aricept -Seems likely to need placement following this hospitalization  Anemia -Uncertain baseline -Mild normocytic anemia -Will follow  DVT prophylaxis:  Lovenox  Code Status:  DNR - confirmed with family Family Communication: Wife and daughter present throughout hospitalization Disposition Plan:  Home once clinically improved Consults called: IR, SW  Admission status: Admit - It is my clinical opinion that admission to INPATIENT is reasonable and necessary because this patient will require at least 2 midnights in the hospital to treat this condition based on the medical complexity of the problems presented.  Given the aforementioned information, the predictability of an adverse outcome is felt to be significant.    Jonah Blue MD [facility]  If note is complete, please contact covering daytime or nighttime physician. [email or web address] Password [email or web address]  08/13/2017, 5:11 PM

## 2017-08-13 NOTE — Procedures (Signed)
PreOperative Dx: LEFT pleural effusion Postoperative Dx: LEFT pleural effusion Procedure:   US guided LEFT thoracentesis Radiologist:  Tyron RussellBoles Anesthesia:  10 ml of 1% lidocaine Specimen:  1.7 L of yellow colored fluid EBL:   < 1 ml Complications: Small LEFT pneumothorax - patient asymptomatic

## 2017-08-13 NOTE — ED Triage Notes (Signed)
Pt sent from [facility] in [place] for evaluation of leg swelling since June and 10 lb wt gain. Pt denies any complaints.

## 2017-08-13 NOTE — ED Notes (Signed)
CRITICAL VALUE ALERT  Critical Value:  Troponin 0.03  Date & Time Notied:  Notified Dr. Judd Lienelo at 08/13/17 1402   Provider Notified: Dr Judd Lienelo  Orders Received/Actions taken: Notified MD

## 2017-08-13 NOTE — Telephone Encounter (Signed)
Per daughter and nurse, patient has had an 7lb weight gain (180 lbs) since last office visit (7/28 173lbs). Patient is weak and very short of breath. Patient is unable to complete ADL's due to being so short of breath. Patient has had no appetite over the last week. Advised daughter that patient it would be best for patient to be evaluated in ER to see if IV diuretic was needed. Daughter verbalized understanding. Will send to Dr. Diona BrownerMcDowell as well. [facility]

## 2017-08-13 NOTE — ED Provider Notes (Signed)
[facility]-EMERGENCY DEPT Provider Note   CSN: [id number] Arrival date & time: 08/13/17  1140     History   Chief Complaint Chief Complaint  Patient presents with  . Leg Swelling    HPI [name] [name] is a 83 y.o. male.  Patient is a 83 year old male with past medical history of coronary artery disease, congestive heart failure, and dementia presenting for evaluation of leg swelling, weight gain, and shortness of breath. This worsened over the past several days. He was sent here by his assisted living facility for evaluation. He denies any fevers or chills, but the family does report a moist cough. Patient denies any specific aches or pains, however history is somewhat limited secondary to his history of dementia.   The history is provided by the patient.    Past Medical History:  Diagnosis Date  . Alzheimer's disease   . CAD (coronary artery disease)    BMS to circumflex 2008  . CHF (congestive heart failure) (HCC)   . Diastolic heart failure (HCC)   . Legal blindness    Macular degeneration  . Mixed hyperlipidemia   . OSA (obstructive sleep apnea)   . PAD (peripheral artery disease) (HCC)   . Symptomatic bradycardia    Medtronic pacemaker  . Type 2 diabetes mellitus [facility](HCC)     Patient Active Problem List   Diagnosis Date Noted  . Sick sinus syndrome (HCC) 06/29/2015  . Pacemaker-Medtronic 08/31/2012  . Bradycardia 03/17/2012  . ESSENTIAL HYPERTENSION, BENIGN 09/27/2009  . DM 09/26/2009  . PURE HYPERCHOLESTEROLEMIA 09/26/2009  . HYPERLIPIDEMIA 09/26/2009  . HYPOSMOLALITY AND/OR HYPONATREMIA 09/26/2009  . DEMENTIA IN CONDITIONS CLASSIFIED ELSEWHERE 09/26/2009  . OBSTRUCTIVE SLEEP APNEA 09/26/2009  . ALZHEIMERS DISEASE 09/26/2009  . MACULAR DEGENERATION OF RETINA UNSPECIFIED 09/26/2009  . LE[place] BLINDNESS, AS DE[place]ED IN [place] 09/26/2009  . HTN CKD UNS W/CKD STAGE I THRU STAGE IV/UNS 09/26/2009  . CORONARY ATHEROSCLEROSIS NATIVE CORONARY ARTERY 09/26/2009  . ACUTE ON  CHRONIC DIASTOLIC HEART FAILURE 09/26/2009  . PVD 09/26/2009  . CARDIAC PACEMAKER IN SITU 09/26/2009  . POSTSURG PERCUT TRANSLUMINAL COR ANGPLSTY STS 09/26/2009    Past Surgical History:  Procedure Laterality Date  . APPENDECTOMY    . CATARACT SURGERY    . CHOLECYSTECTOMY    . Implantation of a dual-chamber pacemaker  January 17, 2005       Home Medications    Prior to Admission medications   Medication Sig Start Date End Date Taking? Authorizing Provider  benazepril (LOTENSIN) 20 MG tablet Take 20 mg by mouth every morning.    [provider]  Docusate Sodium (COLACE PO) Take by mouth daily.    [provider]  donepezil (ARICEPT) 10 MG tablet Take 10 mg by mouth at bedtime.     [provider]  furosemide (LASIX) 20 MG tablet Take 20 mg by mouth daily. 04/10/17   [provider]  insulin aspart (NOVOLOG) 100 UNIT/ML injection Inject into the skin. Sliding scale    [provider]  insulin glargine (LANTUS) 100 UNIT/ML injection Inject 15 Units into the skin 2 (two) times daily.     [provider]  metoprolol tartrate (LOPRESSOR) 25 MG tablet Take 25 mg by mouth daily.    [provider]  potassium chloride (K-DUR,KLOR-CON) 10 MEQ tablet Take 10 mEq by mouth daily. 04/10/17   [provider]  pravastatin (PRAVACHOL) 20 MG tablet Take 20 mg by mouth daily.      [provider]  pregabalin (  LYRICA) 75 MG capsule Take 75 mg by mouth daily.     [provider]    Family History No family history on file.  Social History Social History  Substance Use Topics  . Smoking status: Never Smoker  . Smokeless tobacco: Never Used  . Alcohol use No     Allergies   Amlodipine; Bextra [valdecoxib]; Diclofenac; Gabapentin; and Zocor [simvastatin]   Review of Systems Review of Systems  All other systems reviewed and are negative.    Physical Exam Updated Vital Signs There were no vitals  taken for this visit.  Physical Exam  Constitutional: He is oriented to person, place, and time. He appears well-developed and well-nourished. No distress.  HENT:  Head: Normocephalic and atraumatic.  Mouth/Throat: Oropharynx is clear and moist.  Neck: Normal range of motion. Neck supple.  Cardiovascular: Normal rate, regular rhythm and normal heart sounds.  Exam reveals no friction rub.   No murmur heard. Pulmonary/Chest: Effort normal. No respiratory distress. He has no wheezes. He has rales.  There are slight rales in the bases bilaterally.  Abdominal: Soft. Bowel sounds are normal. He exhibits no distension. There is no tenderness.  Musculoskeletal: Normal range of motion. He exhibits edema.  There is 2+ pitting edema both lower extremities.  Neurological: He is alert and oriented to person, place, and time. Coordination normal.  Skin: Skin is warm and dry. He is not diaphoretic.  Nursing note and vitals reviewed.    ED Treatments / Results  Labs (all labs ordered are listed, but only abnormal results are displayed) Labs Reviewed  CBC WITH DIFFERENTIAL/PLATELET  COMPREHENSIVE METABOLIC PANEL  BRAIN NATRIURETIC PEPTIDE  TROPONIN I    EKG  EKG Interpretation None       Radiology No results found.  Procedures Procedures (including critical care time)  Medications Ordered in ED Medications  furosemide (LASIX) injection 40 mg (not administered)     Initial Impression / Assessment and Plan / ED Course  I have reviewed the triage vital signs and the nursing notes.  Pertinent labs & imaging results that were available during my care of the patient were reviewed by me and considered in my medical decision making (see chart for details).  Workup reveals elevated BNP and a large left-sided pleural effusion. The patient was given Lasix. He is somewhat hypoxic with oxygen saturation in the upper 80s and low 90s. I spoke with Dr. Ophelia Charter regarding admission. She is  requesting consultation with interventional radiology for possible drainage of the effusion. I've spoken with Dr. Tyron Russell regarding this possibility. Patient will be admitted to the hospitalist service for further evaluation and treatment.  Final Clinical Impressions(s) / ED Diagnoses   Final diagnoses:  None    New Prescriptions New Prescriptions   No medications on file     Geoffery Lyons, MD 08/13/17 1643

## 2017-08-14 ENCOUNTER — Inpatient Hospital Stay (HOSPITAL_COMMUNITY): Payer: Medicare Other

## 2017-08-14 DIAGNOSIS — I10 Essential (primary) hypertension: Secondary | ICD-10-CM

## 2017-08-14 DIAGNOSIS — I5033 Acute on chronic diastolic (congestive) heart failure: Secondary | ICD-10-CM

## 2017-08-14 DIAGNOSIS — I509 Heart failure, unspecified: Secondary | ICD-10-CM

## 2017-08-14 DIAGNOSIS — I361 Nonrheumatic tricuspid (valve) insufficiency: Secondary | ICD-10-CM

## 2017-08-14 DIAGNOSIS — J9621 Acute and chronic respiratory failure with hypoxia: Secondary | ICD-10-CM

## 2017-08-14 DIAGNOSIS — I340 Nonrheumatic mitral (valve) insufficiency: Secondary | ICD-10-CM

## 2017-08-14 LAB — ECHOCARDIOGRAM COMPLETE
AO mean calculated velocity dopler: 148 cm/s
AOVTI: 49.1 cm
AV Area VTI: 1.12 cm2
AV Peak grad: 21 mmHg
AV VEL mean LVOT/AV: 0.35
AV area mean vel ind: 0.56 cm2/m2
AV peak Index: 0.57
AVAREAMEANV: 1.09 cm2
AVAREAVTIIND: 0.55 cm2/m2
AVG: 11 mmHg
AVLVOTPG: 3 mmHg
AVPKVEL: 231 cm/s
Ao pk vel: 0.36 m/s
CHL CUP AV VEL: 1.07
CHL CUP DOP CALC LVOT VTI: 16.8 cm
CHL CUP STROKE VOLUME: 53 mL
CHL CUP TV REG PEAK VELOCITY: 308 cm/s
E decel time: 169 msec
FS: 19 % — AB (ref 28–44)
IVS/LV PW RATIO, ED: 1.2
LA ID, A-P, ES: 35 mm
LA diam end sys: 35 mm
LA diam index: 1.78 cm/m2
LA vol index: 35 mL/m2
LA vol: 68.6 mL
LAVOLA4C: 41.4 mL
LV SIMPSON'S DISK: 43
LV sys vol index: 36 mL/m2
LVDIAVOL: 124 mL (ref 62–150)
LVDIAVOLIN: 63 mL/m2
LVOT area: 3.14 cm2
LVOT diameter: 20 mm
LVOTPV: 82.4 cm/s
LVOTSV: 53 mL
LVOTVTI: 0.34 cm
LVSYSVOL: 71 mL — AB (ref 21–61)
MV Dec: 169
MV Peak grad: 2 mmHg
MV pk E vel: 76 m/s
MVPKAVEL: 107 m/s
PW: 14.4 mm — AB (ref 0.6–1.1)
RV LATERAL S' VELOCITY: 11.4 cm/s
RV sys press: 46 mmHg
TAPSE: 16.8 mm
TR max vel: 308 cm/s
Valve area index: 0.55
Valve area: 1.07 cm2
WEIGHTICAEL: 2673.6 [oz_av]

## 2017-08-14 LAB — TROPONIN I
Troponin I: 0.04 ng/mL (ref <0.03)
Troponin I: 0.04 ng/mL (ref <0.03)

## 2017-08-14 LAB — CBC WITH DIFFERENTIAL/PLATELET
BASOS ABS: 0 10*3/uL (ref 0.0–0.1)
Basophils Relative: 0 %
EOS PCT: 1 %
Eosinophils Absolute: 0 10*3/uL (ref 0.0–0.7)
HEMATOCRIT: 30.9 % — AB (ref 39.0–52.0)
Hemoglobin: 10.9 g/dL — ABNORMAL LOW (ref 13.0–17.0)
LYMPHS PCT: 3 %
Lymphs Abs: 0.2 10*3/uL — ABNORMAL LOW (ref 0.7–4.0)
MCH: 31.4 pg (ref 26.0–34.0)
MCHC: 35.3 g/dL (ref 30.0–36.0)
MCV: 89 fL (ref 78.0–100.0)
Monocytes Absolute: 0.8 10*3/uL (ref 0.1–1.0)
Monocytes Relative: 11 %
NEUTROS ABS: 6 10*3/uL (ref 1.7–7.7)
Neutrophils Relative %: 85 %
PLATELETS: 198 10*3/uL (ref 150–400)
RBC: 3.47 MIL/uL — AB (ref 4.22–5.81)
RDW: 15.7 % — ABNORMAL HIGH (ref 11.5–15.5)
WBC: 7.1 10*3/uL (ref 4.0–10.5)

## 2017-08-14 LAB — BASIC METABOLIC PANEL WITH GFR
Anion gap: 7 (ref 5–15)
BUN: 16 mg/dL (ref 6–20)
CO2: 26 mmol/L (ref 22–32)
Calcium: 8.2 mg/dL — ABNORMAL LOW (ref 8.9–10.3)
Chloride: 94 mmol/L — ABNORMAL LOW (ref 101–111)
Creatinine, Ser: 1.17 mg/dL (ref 0.61–1.24)
GFR calc Af Amer: >60 mL/min (ref >60)
GFR calc non Af Amer: 52 mL/min — ABNORMAL LOW (ref >60)
Glucose, Bld: 177 mg/dL — ABNORMAL HIGH (ref 65–99)
Potassium: 3.8 mmol/L (ref 3.5–5.1)
Sodium: 127 mmol/L — ABNORMAL LOW (ref 135–145)

## 2017-08-14 LAB — GLUCOSE, CAPILLARY
GLUCOSE-CAPILLARY: 145 mg/dL — AB (ref 65–99)
Glucose-Capillary: 171 mg/dL — ABNORMAL HIGH (ref 65–99)
Glucose-Capillary: 200 mg/dL — ABNORMAL HIGH (ref 65–99)

## 2017-08-14 LAB — MRSA PCR SCREENING: MRSA BY PCR: NEGATIVE

## 2017-08-14 MED ORDER — ORAL CARE MOUTH RINSE
15.0000 mL | Freq: Two times a day (BID) | OROMUCOSAL | Status: DC
  Administered 2017-08-14 – 2017-08-15 (×3): 15 mL via OROMUCOSAL

## 2017-08-14 MED ORDER — METOPROLOL TARTRATE 5 MG/5ML IV SOLN
5.0000 mg | Freq: Once | INTRAVENOUS | Status: DC
  Filled 2017-08-14 (×2): qty 5

## 2017-08-14 MED ORDER — METOPROLOL TARTRATE 5 MG/5ML IV SOLN
2.5000 mg | Freq: Once | INTRAVENOUS | Status: AC
Start: 2017-08-14 — End: 2017-08-14
  Administered 2017-08-14: 2.5 mg via INTRAVENOUS
  Filled 2017-08-14: qty 5

## 2017-08-14 NOTE — Care Management Important Message (Signed)
Important Message  Patient Details  Name: [name] [name] MRN: [id number] Date of Birth: 04/23/1921   Medicare Important Message Given:  Yes    Malcolm Metro, RN 08/14/2017, 1:05 PM

## 2017-08-14 NOTE — Evaluation (Signed)
Occupational Therapy Evaluation Patient Details Name: [name] [name] MRN: [id number] DOB: Aug 26, 1921 Today's Date: 08/14/2017    History of Present Illness  [name] [name] is a 83 y.o. male with medical history significant of dementia; DM; symptomatic bradycardia s/p dual-chamber pacemaker placement; PAD; OSA not on CPAP; HLD; visual impairment associated with macular degeneration; diastolic heart failure (grade 1 by echo in 2013, preserved EF at that time); and CAD.  Family reports that the patient has been getting weaker for over a month and coughing a lot.  Cough has been going on for a while.  They have been in and out of the hospital with CHF or PNA often over the last 2 years but are usually hospitalized at [facility].  He has gained 10 pounds within a month.  LE edema particularly bad since yesterday.  Complains of left side/flank pain. Not eating well.  Pees 1-3 times overnight but only once or twice during the day.  Cough is particuarly bad over the last 1-2 weeks.  Cough sounds wet but not productive.  Previously on dysphagia diet but this decreased PO intake to the point of dehydration so they opted for regular soft diet.     Clinical Impression   Pt received semi-reclined in bed, agreeable to OT evaluation. Pt very pleasant and cooperative during session, cognition limiting accuracy of PLOF information. Pt able to follow one-step commands consistently, requires consistent cuing for safety during functional mobility. Pt reports he uses a walker stick, unsure of accuracy of information; pt unsteady with no equipment, improves with RW use although requires cuing to remain inside walker. Pt also requiring assistance to maintain standing during ADL completion, is able to complete seated tasks with minimal difficulty. Per chart review, pt was active with [facility] services, unsure if he was receiving rehab services. Recommend returning to ALF with 24/7 supervision and HHOT services to improve  strength, independence and safety during ADL completion. No further acute OT services required at this time.     Follow Up Recommendations  Home health OT;Supervision/Assistance - 24 hour    Equipment Recommendations  None recommended by OT       Precautions / Restrictions Precautions Precautions: Fall Restrictions Weight Bearing Restrictions: No      Mobility Bed Mobility Overal bed mobility: Modified Independent             General bed mobility comments: Increased time and cuing to come to EOB  Transfers Overall transfer level: Needs assistance Equipment used: Rolling walker (2 wheeled) Transfers: Sit to/from Stand Sit to Stand: Min assist                  ADL either performed or assessed with clinical judgement   ADL Overall ADL's : Needs assistance/impaired Eating/Feeding: Set up;Cueing for safety;Bed level Eating/Feeding Details (indicate cue type and reason): Pt requiring verbal cuing for slowing down and swallowing before taking additional bites Grooming: Wash/dry hands;Moderate assistance;Standing Grooming Details (indicate cue type and reason): Pt requiring mod assist to maintain standing at sink during hand washing. Pt unaware of posterior lean, able to correct with consistent verbal cuing.          Upper Body Dressing : Moderate assistance;Sitting Upper Body Dressing Details (indicate cue type and reason): Assist for managing buttons and ties Lower Body Dressing: Supervision/safety;Sitting/lateral leans               Functional mobility during ADLs: Min guard;Cueing for safety;Cueing for sequencing;Rolling walker General ADL Comments: Pt  requiring cuing for correct use of walker during functional mobility     Vision Baseline Vision/History: No visual deficits Patient Visual Report: No change from baseline Vision Assessment?: No apparent visual deficits            Pertinent Vitals/Pain Pain Assessment: No/denies pain     Hand  Dominance Right   Extremity/Trunk Assessment Upper Extremity Assessment Upper Extremity Assessment: Generalized weakness       Cervical / Trunk Assessment Cervical / Trunk Assessment: Kyphotic   Communication Communication Communication: No difficulties   Cognition Arousal/Alertness: Awake/alert Behavior During Therapy: WFL for tasks assessed/performed Overall Cognitive Status: History of cognitive impairments - at baseline                                                Home Living Family/patient expects to be discharged to:: Assisted living                                 Additional Comments: Pt reports he uses a walking stick       Prior Functioning/Environment Level of Independence: Needs assistance  Gait / Transfers Assistance Needed: Pt reports he uses a walking stick ADL's / Homemaking Assistance Needed: Staff at [facility] assist with dressing, bathing, grooming, meal preparation            OT Problem List: Decreased activity tolerance;Decreased strength;Impaired balance (sitting and/or standing);Decreased safety awareness          End of Session Equipment Utilized During Treatment: Gait belt;Rolling walker  Activity Tolerance: Patient tolerated treatment well Patient left: in chair;with call bell/phone within reach  OT Visit Diagnosis: Muscle weakness (generalized) (M62.81)                Time: 1610-9604 OT Time Calculation (min): 34 min Charges:  OT General Charges $OT Visit: 1 Procedure OT Evaluation $OT Eval Low Complexity: 1 Procedure    Ezra Sites, OTR/L  [phone] 08/14/2017, 8:57 AM

## 2017-08-14 NOTE — Evaluation (Signed)
Physical Therapy Evaluation Patient Details Name: [name] [name] MRN: [id number] DOB: [birth year] Today's Date: 08/14/2017   History of Present Illness  [name] [name] is a 83 y.o. male with medical history significant of dementia; DM; symptomatic bradycardia s/p dual-chamber pacemaker placement; PAD; OSA not on CPAP; HLD; visual impairment associated with macular degeneration; diastolic heart failure (grade 1 by echo in 2013, preserved EF at that time); and CAD.  Family reports that the patient has been getting weaker for over a month and coughing a lot.  Cough has been going on for a while.  They have been in and out of the hospital with CHF or PNA often over the last 2 years but are usually hospitalized at [facility].  He has gained 10 pounds within a month.  LE edema particularly bad since yesterday.  Complains of left side/flank pain. Not eating well.  Pees 1-3 times overnight but only once or twice during the day.  Cough is particuarly bad over the last 1-2 weeks.  Cough sounds wet but not productive.  Previously on dysphagia diet but this decreased PO intake to the point of dehydration so they opted for regular soft diet.     Clinical Impression  Patient near PLOF and demonstrates slightly labored bed mobility, sit to stands, no loss of balance during gait training, limited secondary to c/o fatigue and tolerated sitting up at bedside with family members present after PT visit.    Follow Up Recommendations Supervision/Assistance - 24 hour    Equipment Recommendations  None recommended by PT    Recommendations for Other Services       Precautions / Restrictions Precautions Precautions: Fall Restrictions Weight Bearing Restrictions: No      Mobility  Bed Mobility Overal bed mobility: Needs Assistance Bed Mobility: Sit to Supine       Sit to supine: Min guard      Transfers Overall transfer level: Needs assistance Equipment used: Rolling walker (2 wheeled) Transfers: Sit to/from  Stand Sit to Stand: Min guard            Ambulation/Gait Ambulation/Gait assistance: Min guard Ambulation Distance (Feet): 65 Feet Assistive device: Rolling walker (2 wheeled) Gait Pattern/deviations: Decreased stride length   Gait velocity interpretation: at or above normal speed for age/gender General Gait Details: Slightly labored cadence without loss of balance, limited secondary to fatigue  Stairs            Wheelchair Mobility    Modified Rankin (Stroke Patients Only)       Balance Overall balance assessment: Needs assistance Sitting-balance support: Feet supported Sitting balance-Leahy Scale: Fair     Standing balance support: Bilateral upper extremity supported;During functional activity Standing balance-Leahy Scale: Fair Standing balance comment: slightly unsteady                             Pertinent Vitals/Pain Pain Assessment: No/denies pain    Home Living Family/patient expects to be discharged to:: Assisted living (Deciding to stay at or assisted living or moving back home) Living Arrangements: Spouse/significant other Available Help at Discharge: Family Type of Home: House Home Access: Stairs to enter Entrance Stairs-Rails: Right Entrance Stairs-Number of Steps: 2 Home Layout: One level Home Equipment: Walker - 2 wheels;Cane - quad;Wheelchair - manual      Prior Function Level of Independence: Needs assistance   Gait / Transfers Assistance Needed: Supervison  ADL's / Homemaking Assistance Needed: Min assist  Comments: Patient legally  blind, severely limited vision in left eye     Hand Dominance        Extremity/Trunk Assessment   Upper Extremity Assessment Upper Extremity Assessment: Overall WFL for tasks assessed    Lower Extremity Assessment Lower Extremity Assessment: Overall WFL for tasks assessed    Cervical / Trunk Assessment Cervical / Trunk Assessment: Kyphotic  Communication   Communication: No  difficulties  Cognition Arousal/Alertness: Awake/alert Behavior During Therapy: WFL for tasks assessed/performed Overall Cognitive Status: Within Functional Limits for tasks assessed                                        General Comments      Exercises     Assessment/Plan    PT Assessment Patient needs continued PT services  PT Problem List Decreased strength;Decreased activity tolerance;Decreased balance       PT Treatment Interventions Gait training;Therapeutic activities;Therapeutic exercise;Patient/family education;Stair training    PT Goals (Current goals can be found in the Care Plan section)  Acute Rehab PT Goals PT Goal Formulation: With patient Time For Goal Achievement: 08/21/17 Potential to Achieve Goals: Good    Frequency Min 3X/week   Barriers to discharge        Co-evaluation               AM-PAC PT "6 Clicks" Daily Activity  Outcome Measure Difficulty turning over in bed (including adjusting bedclothes, sheets and blankets)?: A Little Difficulty moving from lying on back to sitting on the side of the bed? : A Little Difficulty sitting down on and standing up from a chair with arms (e.g., wheelchair, bedside commode, etc,.)?: A Little Help needed moving to and from a bed to chair (including a wheelchair)?: A Little Help needed walking in hospital room?: A Little Help needed climbing 3-5 steps with a railing? : A Little 6 Click Score: 18    End of Session Equipment Utilized During Treatment: Gait belt Activity Tolerance: Patient limited by fatigue Patient left: with call bell/phone within reach;with family/visitor present (seated at bedside to eat lunch with family present in room) Nurse Communication: Mobility status PT Visit Diagnosis: Unsteadiness on feet (R26.81);Muscle weakness (generalized) (M62.81);Other abnormalities of gait and mobility (R26.89)    Time: 4098-1191 PT Time Calculation (min) (ACUTE ONLY): 42  min   Charges:   PT Evaluation $PT Eval Low Complexity: 1 Low PT Treatments $Gait Training: 8-22 mins   PT G Codes:          Ocie Bob, MPT 08/14/2017, 1:47 PM

## 2017-08-14 NOTE — Progress Notes (Signed)
Patient on telemetry.  Telemetry shows increased heart rate with pulse around 150.  Patient resting quietly in bed. NO complaints of pain at this time.  BP 120/63.  Notified mid-level.  Received order to give night time dose of lopressor now.  Med given.  Will continue to monitor patient.

## 2017-08-14 NOTE — Progress Notes (Signed)
Notified mid-level of no change of pulse.  Patient still resting quietly. Lopressor given earlier per order.  Mid-level gave no new orders at this time is watching and caring for patient and will call back with orders if needed.

## 2017-08-14 NOTE — H&P (Signed)
PROGRESS NOTE    [name] [name]  JWL:[id number] DOB: 30-Aug-1921 DOA: 08/13/2017 PCP: [name], MD     Brief Narrative:    83 y.o. male with medical history significant of dementia; DM; symptomatic bradycardia s/p dual-chamber pacemaker placement; PAD; OSA not on CPAP; HLD; visual impairment associated with macular degeneration; diastolic heart failure (grade 1 by echo in 2013, preserved EF at that time); and CAD presents with CHF exacerbation and B/L pleural effusions .     Assessment & Plan:   Principal Problem:   Acute on chronic respiratory failure with hypoxia (HCC) Active Problems:   Type 2 diabetes mellitus with other specified complication (HCC)   ALZHEIMERS DISEASE   Essential hypertension, benign   Acute on chronic diastolic heart failure (HCC)   Normocytic anemia   Pleural effusion on left  Acute on chronic respiratory failure with hypoxia associated with heart failure and pleural effusion  -Responds well to the Lasix diuresis -1.6 L overnight shortness of breath is improving continue to diuresis.  -DM -Continue Lantus -Cover with sensitive-scale SSI   HTN  WELL CONTROLLED. -Lopressor 12.5 mg BID. -Continue ACE  Dementia  -Family denies behavioral issues -Continue Aricept      DVT prophylaxis: (Lovenox) Code Status: DNR  Family Communication: (NONE  Disposition Plan: (HOME WITH HOME THERAPY)   Consultants:   NONE     Procedures: NONE  Antimicrobials: (specify start and planned stop date. Auto populated tables are space occupying and do not give end dates)  NONE      Subjective:  Patient reports improvement in his shortness of breath he has been diuresed well overnight with -1.6 L. Denies any nausea or vomiting fevers or chills or cough.  Objective: Vitals:   08/13/17 2030 08/13/17 2246 08/14/17 0520 08/14/17 0605  BP:   (!) 141/65   Pulse:   73   Resp:   16   Temp:   98 F (36.7 C)   TempSrc:   Oral   SpO2: 98%  98%    Weight: 75.8 kg (167 lb 1.6 oz) 75.8 kg (167 lb 1.6 oz)  75.8 kg (167 lb 1.6 oz)    Intake/Output Summary (Last 24 hours) at 08/14/17 1016 Last data filed at 08/14/17 0842  Gross per 24 hour  Intake              240 ml  Output             1850 ml  Net            -1610 ml   Filed Weights   08/13/17 2030 08/13/17 2246 08/14/17 0605  Weight: 75.8 kg (167 lb 1.6 oz) 75.8 kg (167 lb 1.6 oz) 75.8 kg (167 lb 1.6 oz)    Examination:  General exam: Appears calm and comfortable  Respiratory system: Crackles at the bases. Cardiovascular system: S1 & S2 heard, RRR. No JVD, murmurs, rubs, gallops or clicks. No pedal edema. Gastrointestinal system: Abdomen is nondistended, soft and nontender. No organomegaly or masses felt. Normal bowel sounds heard. Central nervous system: Alert and oriented. No focal neurological deficits. Extremities: Symmetric 5 x 5 power. Skin: No rashes, lesions or ulcers Psychiatry: Judgement and insight appear normal. Mood & affect appropriate.     Data Reviewed: I have personally reviewed following labs and imaging studies  CBC:  Recent Labs Lab 08/13/17 1259 08/14/17 0253  WBC 5.8 7.1  NEUTROABS 4.7 6.0  HGB 11.3* 10.9*  HCT 33.3* 30.9*  MCV 89.5  89.0  PLT 216 198   Basic Metabolic Panel:  Recent Labs Lab 08/13/17 1259 08/14/17 0253  NA 126* 127*  K 4.8 3.8  CL 94* 94*  CO2 24 26  GLUCOSE 228* 177*  BUN 15 16  CREATININE 1.10 1.17  CALCIUM 8.3* 8.2*   GFR: Estimated Creatinine Clearance: 42.3 mL/min (by C-G formula based on SCr of 1.17 mg/dL). Liver Function Tests:  Recent Labs Lab 08/13/17 1259  AST 14*  ALT 16*  ALKPHOS 57  BILITOT 0.9  PROT 6.4*  ALBUMIN 3.2*   No results for input(s): LIPASE, AMYLASE in the last 168 hours. No results for input(s): AMMONIA in the last 168 hours. Coagulation Profile: No results for input(s): INR, PROTIME in the last 168 hours. Cardiac Enzymes:  Recent Labs Lab 08/13/17 1259  08/13/17 2057 08/14/17 0253 08/14/17 0852  TROPONINI 0.03* 0.04* 0.04* 0.04*   BNP (last 3 results) No results for input(s): PROBNP in the last 8760 hours. HbA1C: No results for input(s): HGBA1C in the last 72 hours. CBG:  Recent Labs Lab 08/13/17 1355 08/13/17 2048 08/14/17 0741  GLUCAP 210* 231* 171*   Lipid Profile: No results for input(s): CHOL, HDL, LDLCALC, TRIG, CHOLHDL, LDLDIRECT in the last 72 hours. Thyroid Function Tests: No results for input(s): TSH, T4TOTAL, FREET4, T3FREE, THYROIDAB in the last 72 hours. Anemia Panel: No results for input(s): VITAMINB12, FOLATE, FERRITIN, TIBC, IRON, RETICCTPCT in the last 72 hours. Urine analysis: No results found for: COLORURINE, APPEARANCEUR, LABSPEC, PHURINE, GLUCOSEU, HGBUR, BILIRUBINUR, KETONESUR, PROTEINUR, UROBILINOGEN, NITRITE, LEUKOCYTESUR Sepsis Labs: @LABRCNTIP (procalcitonin:4,lacticidven:4)  ) Recent Results (from the past 240 hour(s))  Gram stain     Status: None   Collection Time: 08/13/17  5:06 PM  Result Value Ref Range Status   Specimen Description PLEURAL  Final   Special Requests NONE  Final   Gram Stain   Final    NO ORGANISMS SEEN NO WBC SEEN Performed at [facility]    Report Status 08/13/2017 FINAL  Final  Culture, body fluid-bottle     Status: None (Preliminary result)   Collection Time: 08/13/17  5:07 PM  Result Value Ref Range Status   Specimen Description PLEURAL  Final   Special Requests BOTTLES DRAWN AEROBIC AND ANAEROBIC 10CC  Final   Culture NO GROWTH < 12 HOURS  Final   Report Status PENDING  Incomplete  MRSA PCR Screening     Status: None   Collection Time: 08/13/17 11:55 PM  Result Value Ref Range Status   MRSA by PCR NEGATIVE NEGATIVE Final    Comment:        The GeneXpert MRSA Assay (FDA approved for NASAL specimens only), is one component of a comprehensive MRSA colonization surveillance program. It is not intended to diagnose MRSA infection nor to guide  or monitor treatment for MRSA infections.          Radiology Studies: Dg Chest 1 View  Result Date: 08/13/2017 CLINICAL DATA:  LEFT pneumothorax post thoracentesis with removal of 1.7 L of LEFT pleural fluid EXAM: CHEST 1 VIEW COMPARISON:  Earlier study of 08/13/2017 FINDINGS: Small LEFT apical and basilar LEFT pneumothorax identified. No mediastinal shift. Patient was asymptomatic at time of radiograph. Persistent pleural effusion at LEFT base with partial atelectasis of the lower LEFT lung. Probable small subpulmonic RIGHT pleural effusion. Question mild pulmonary edema in RIGHT lung. Stable LEFT subclavian transvenous pacemaker leads projecting over RIGHT atrium and RIGHT ventricle. Dense atherosclerotic calcification aorta. IMPRESSION: New small LEFT apical and  basilar pneumothorax post LEFT thoracentesis and removal of 1.7 L of fluid without mediastinal shift/evidence of tension. Residual LEFT basilar pleural effusion and atelectasis. Followup chest radiograph ordered in 2 hours to assess stability. Electronically Signed   By: Ulyses Southward M.D.   On: 08/13/2017 18:04   Dg Chest 2 View  Result Date: 08/13/2017 CLINICAL DATA:  Shortness of breath. EXAM: CHEST  2 VIEW COMPARISON:  Chest x-ray dated May 03, 2017. FINDINGS: Left chest wall AICD with leads terminating in the right atrium and right ventricle, unchanged. Cardiomegaly. New moderate to large left pleural effusion with dense opacification of the lingula and left lower lobe. Right basilar atelectasis. The right lung is otherwise clear. No pneumothorax. No acute osseous abnormality. Status post cholecystectomy. IMPRESSION: New moderate to large left pleural effusion with adjacent opacification of the lingula and left lower lobe, favored to represent atelectasis. Superimposed pneumonia cannot be excluded. Electronically Signed   By: Obie Dredge M.D.   On: 08/13/2017 13:51   Dg Chest Port 1 View  Result Date: 08/14/2017 CLINICAL DATA:   Follow-up pneumothorax EXAM: PORTABLE CHEST 1 VIEW COMPARISON:  08/13/2017 FINDINGS: Cardiac shadow is again enlarged. Pacing device is again seen. Bilateral pleural effusions are noted left greater than right stable left pneumothorax is noted. No new focal abnormality is noted. Aortic calcifications are again seen. IMPRESSION: Stable bilateral pleural effusions left greater than right. Small pneumothorax on the left stable from the prior exam. Electronically Signed   By: Alcide Clever M.D.   On: 08/14/2017 06:59   Dg Chest Port 1 View  Result Date: 08/13/2017 CLINICAL DATA:  Thoracentesis with left pneumothorax EXAM: PORTABLE CHEST 1 VIEW COMPARISON:  08/13/2017 FINDINGS: Small right pleural effusion. Moderate left pleural effusion without significant interval change. No significant interval change in size of small left apical and lower pneumothorax. No midline shift. Slight increased consolidation at the left lung base. Cardiomegaly with atherosclerosis. Central vascular congestion and slight increased pulmonary edema. IMPRESSION: 1. No significant interval change in size of small left pneumothorax. No midline shift 2. Residual left greater than right pleural effusions with worsened consolidation at the left base 3. Cardiomegaly with central vascular congestion and increased pulmonary edema since the prior study. Electronically Signed   By: Jasmine Pang M.D.   On: 08/13/2017 20:20   Dg Chest Port 1v Same Day  Result Date: 08/13/2017 CLINICAL DATA:  Status post thoracentesis. Shortness of breath. Initial encounter. EXAM: PORTABLE CHEST 1 VIEW COMPARISON:  Chest radiograph performed earlier today at 7:55 p.m. FINDINGS: Small right and small to moderate left-sided pleural effusions are noted. Bibasilar airspace opacities may reflect asymmetric pulmonary edema or pneumonia. The previously noted small left-sided pneumothorax has decreased in size. The cardiomediastinal silhouette is borderline normal in size. A  pacemaker is noted overlying the left chest wall, with leads ending overlying the right atrium and right ventricle. No acute osseous abnormalities are seen. IMPRESSION: 1. Small right and small to moderate left-sided pleural effusions noted. Bibasilar airspace opacities may reflect asymmetric pulmonary edema or pneumonia. 2. Previously noted small left-sided pneumothorax has decreased in size. Electronically Signed   By: Roanna Raider M.D.   On: 08/13/2017 22:09   US Thoracentesis Asp Pleural Space W/img Guide  Result Date: 08/13/2017 INDICATION: Shortness of breath, large LEFT pleural effusion on chest radiograph EXAM: ULTRASOUND GUIDED DIAGNOSTIC AND THERAPEUTIC LEFT THORACENTESIS MEDICATIONS: None. COMPLICATIONS: None immediate. PROCEDURE: Procedure, benefits, and risks of procedure were discussed with patient, patient's wife and patient's daughter. Written  informed consent for procedure was obtained. Time out protocol followed. Pleural effusion localized by ultrasound at the posterior LEFT hemithorax. Skin prepped and draped in usual sterile fashion. Skin and soft tissues anesthetized with 10 mL of 1% lidocaine. 8 French thoracentesis catheter placed into the LEFT pleural space. 1.7 L of clear yellow LEFT pleural fluid aspirated by syringe pump. Procedure tolerated well by patient. Postprocedural chest radiograph demonstrates presence of a small LEFT pneumothorax; patient is asymptomatic. FINDINGS: A total of approximately 1.7 L of LEFT pleural fluid was removed. Samples were sent to the laboratory as requested by the clinical team. IMPRESSION: Successful ultrasound guided LEFT thoracentesis yielding 1.7 L of pleural fluid. Electronically Signed   By: Ulyses Southward M.D.   On: 08/13/2017 17:55        Scheduled Meds: . aspirin EC  81 mg Oral Daily  . benazepril  20 mg Oral Daily  . donepezil  10 mg Oral QHS  . enoxaparin (LOVENOX) injection  40 mg Subcutaneous Q24H  . furosemide  20 mg Intravenous  Q12H  . insulin aspart  0-5 Units Subcutaneous QHS  . insulin aspart  0-9 Units Subcutaneous TID WC  . insulin glargine  15 Units Subcutaneous BID  . loratadine  10 mg Oral Daily  . mouth rinse  15 mL Mouth Rinse BID  . metoprolol tartrate  12.5 mg Oral BID  . pravastatin  20 mg Oral q1800  . pregabalin  75 mg Oral Daily  . sodium chloride flush  3 mL Intravenous Q12H   Continuous Infusions: . sodium chloride       LOS: 1 day    Time spent: 39 m    Efrain Sella, MD [facility]    If 7PM-7AM, please contact night-coverage [email or web address] Password [email or web address] 08/14/2017, 10:16 AM

## 2017-08-14 NOTE — Progress Notes (Signed)
*  PRELIMINARY RESULTS* Echocardiogram 2D Echocardiogram has been performed.  Stacey Drain 08/14/2017, 2:49 PM

## 2017-08-14 NOTE — Progress Notes (Signed)
Pt pulling off O2 and condom catheter, confused at this time. Soft mitts applied to both hands, MD aware. Will continue to monitor.

## 2017-08-15 DIAGNOSIS — L8990 Pressure ulcer of unspecified site, unspecified stage: Secondary | ICD-10-CM | POA: Insufficient documentation

## 2017-08-15 LAB — GLUCOSE, CAPILLARY
GLUCOSE-CAPILLARY: 62 mg/dL — AB (ref 65–99)
GLUCOSE-CAPILLARY: 263 mg/dL — AB (ref 65–99)
GLUCOSE-CAPILLARY: 274 mg/dL — AB (ref 65–99)
GLUCOSE-CAPILLARY: 62 mg/dL — AB (ref 65–99)

## 2017-08-15 LAB — BASIC METABOLIC PANEL
ANION GAP: 8 (ref 5–15)
BUN: 18 mg/dL (ref 6–20)
CHLORIDE: 94 mmol/L — AB (ref 101–111)
CO2: 28 mmol/L (ref 22–32)
Calcium: 8 mg/dL — ABNORMAL LOW (ref 8.9–10.3)
Creatinine, Ser: 1.22 mg/dL (ref 0.61–1.24)
GFR calc non Af Amer: 49 mL/min — ABNORMAL LOW (ref >60)
GFR, EST AFRICAN AMERICAN: 57 mL/min — AB (ref >60)
Glucose, Bld: 83 mg/dL (ref 65–99)
POTASSIUM: 3.4 mmol/L — AB (ref 3.5–5.1)
SODIUM: 130 mmol/L — AB (ref 135–145)

## 2017-08-15 LAB — CBC
HEMATOCRIT: 30.9 % — AB (ref 39.0–52.0)
HEMOGLOBIN: 10.6 g/dL — AB (ref 13.0–17.0)
MCH: 30.8 pg (ref 26.0–34.0)
MCHC: 34.3 g/dL (ref 30.0–36.0)
MCV: 89.8 fL (ref 78.0–100.0)
Platelets: 230 10*3/uL (ref 150–400)
RBC: 3.44 MIL/uL — AB (ref 4.22–5.81)
RDW: 15.9 % — ABNORMAL HIGH (ref 11.5–15.5)
WBC: 5.2 10*3/uL (ref 4.0–10.5)

## 2017-08-15 MED ORDER — METOPROLOL TARTRATE 5 MG/5ML IV SOLN
5.0000 mg | Freq: Once | INTRAVENOUS | Status: AC
  Administered 2017-08-15: 5 mg via INTRAVENOUS

## 2017-08-15 MED ORDER — SODIUM CHLORIDE 0.9 % IV BOLUS (SEPSIS)
500.0000 mL | Freq: Once | INTRAVENOUS | Status: AC
  Administered 2017-08-15: 500 mL via INTRAVENOUS

## 2017-08-15 MED ORDER — POTASSIUM CHLORIDE CRYS ER 10 MEQ PO TBCR: 10.0000 meq | EXTENDED_RELEASE_TABLET | Freq: Two times a day (BID) | ORAL | 1 refills | Status: AC

## 2017-08-15 MED ORDER — FUROSEMIDE 20 MG PO TABS: 20.0000 mg | ORAL_TABLET | Freq: Two times a day (BID) | ORAL | 1 refills | Status: AC

## 2017-08-15 NOTE — Care Management Note (Signed)
Case Management Note  Patient Details  Name: [name] [name] MRN: [id number] Date of Birth: 05/30/1921  Subjective/Objective: 83 y.o. M admitted 08/13/2017 with ARF with Hypoxia from [place]. CM received call from bedside RN that MD was requesting assist with placement in SNF. CM made CSW aware and placed consult.                    Action/Plan:CM will sign off for now but will be available should additional discharge needs arise or disposition change.    Expected Discharge Date:  08/15/17               Expected Discharge Plan:  Skilled Nursing Facility  In-House Referral:  Clinical Social Work  Discharge planning Services  CM Consult  Post Acute Care Choice:  NA Choice offered to:   (received Call from RN)  DME Arranged:    DME Agency:     HH Arranged:    HH Agency:  [facility] (Active pta)  Status of Service:  In process, will continue to follow  If discussed at Long Length of Stay Meetings, dates discussed:    Additional Comments:  Yvone Neu, RN 08/15/2017, 2:11 PM

## 2017-08-15 NOTE — NC FL2 (Signed)
[place] MEDICAID FL2 LEVEL OF CARE SCREENING TOOL     IDENTIFICATION  Patient Name: [name] [name] Birthdate: November 17, 1921 Sex: male Admission Date (Current Location): 08/13/2017  [facility] and [place] Number:  Producer, television/film/video and Address:  [facility],  [address] [place] [address]      Provider Number: [id number]  Attending Physician Name and Address:  Efrain Sella, MD  Relative Name and Phone Number:  Lorrin Jackson, [phone]    Current Level of Care: Hospital Recommended Level of Care: Assisted Living Facility Prior Approval Number:    Date Approved/Denied:   PASRR Number:    Discharge Plan: Other (Comment) (ALF)    Current Diagnoses: Patient Active Problem List   Diagnosis Date Noted  . Pressure injury of skin 08/15/2017  . Acute on chronic respiratory failure with hypoxia (HCC) 08/13/2017  . Normocytic anemia 08/13/2017  . Pleural effusion on left 08/13/2017  . Sick sinus syndrome (HCC) 06/29/2015  . Pacemaker-[employer]tr[employer] 08/31/2012  . Bradycardia 03/17/2012  . Essential hypertension, benign 09/27/2009  . Type 2 diabetes mellitus with other specified complication (HCC) 09/26/2009  . PURE HYPERCHOLESTEROLEMIA 09/26/2009  . HYPERLIPIDEMIA 09/26/2009  . HYPOSMOLALITY AND/OR HYPONATREMIA 09/26/2009  . OBSTRUCTIVE SLEEP APNEA 09/26/2009  . ALZHEIMERS DISEASE 09/26/2009  . MACULAR DEGENERATION OF RETINA UNSPECIFIED 09/26/2009  . LEGAL BLINDNESS, AS DEFINED IN [place] 09/26/2009  . CORONARY ATHEROSCLEROSIS NATIVE CORONARY ARTERY 09/26/2009  . Acute on chronic diastolic heart failure (HCC) 09/26/2009  . PVD 09/26/2009  . CARDIAC PACEMAKER IN SITU 09/26/2009  . POSTSURG PERCUT TRANSLUMINAL COR ANGPLSTY STS 09/26/2009    Orientation RESPIRATION BLADDER Height & Weight     Self  Normal Incontinent Weight: 169 lb 5 oz (76.8 kg) Height:     BEHAVIORAL SYMPTOMS/MOOD NEUROLOGICAL BOWEL NUTRITION STATUS      Continent Diet (diet soft)  AMBULATORY  STATUS COMMUNICATION OF NEEDS Skin   Limited Assist Verbally Normal                       Personal Care Assistance Level of Assistance  Bathing, Feeding, Dressing Bathing Assistance: Limited assistance Feeding assistance: Independent Dressing Assistance: Limited assistance     Functional Limitations Info  Sight, Speech, Hearing Sight Info: Impaired Hearing Info: Impaired Speech Info: Adequate    SPECIAL CARE FACTORS FREQUENCY  PT (By licensed PT), OT (By licensed OT)     PT Frequency: 3x wk OT Frequency: 3x wk            Contractures      Additional Factors Info  Code Status, Allergies Code Status Info: DNR Allergies Info: AMLODIPINE, BEXTRA VALDECOXIB, DICLOFENAC, GABAPENTIN, ZOCOR SIMVASTATIN           Current Medications (08/15/2017):  This is the current hospital active medication list Current Facility-Administered Medications  Medication Dose Route Frequency Provider Last Rate Last Dose  . acetaminophen (TYLENOL) tablet 650 mg  650 mg Oral Q4H PRN Jonah Blue, MD      . aspirin EC tablet 81 mg  81 mg Oral Daily Jonah Blue, MD   81 mg at 08/15/17 1130  . benazepril (LOTENSIN) tablet 20 mg  20 mg Oral Daily Jonah Blue, MD   20 mg at 08/15/17 1130  . donepezil (ARICEPT) tablet 10 mg  10 mg Oral Noemi Chapel, MD   10 mg at 08/14/17 2203  . enoxaparin (LOVENOX) injection 40 mg  40 mg Subcutaneous Q24H Jonah Blue, MD   40 mg at  08/15/17 1129  . furosemide (LASIX) injection 20 mg  20 mg Intravenous Steva Colder, MD   20 mg at 08/15/17 6754  . insulin aspart (novoLOG) injection 0-5 Units  0-5 Units Subcutaneous QHS Jonah Blue, MD   2 Units at 08/13/17 2147  . insulin aspart (novoLOG) injection 0-9 Units  0-9 Units Subcutaneous TID WC Jonah Blue, MD   5 Units at 08/15/17 1245  . insulin glargine (LANTUS) injection 15 Units  15 Units Subcutaneous BID Jonah Blue, MD   15 Units at 08/15/17 1131  . loratadine (CLARITIN)  tablet 10 mg  10 mg Oral Daily Jonah Blue, MD   10 mg at 08/15/17 1129  . MEDLINE mouth rinse  15 mL Mouth Rinse BID Jonah Blue, MD   15 mL at 08/14/17 1000  . metoprolol tartrate (LOPRESSOR) injection 5 mg  5 mg Intravenous Once Elray Mcgregor A, NP      . metoprolol tartrate (LOPRESSOR) tablet 12.5 mg  12.5 mg Oral BID Jonah Blue, MD   12.5 mg at 08/15/17 1129  . ondansetron (ZOFRAN) injection 4 mg  4 mg Intravenous Q6H PRN Jonah Blue, MD      . pravastatin (PRAVACHOL) tablet 20 mg  20 mg Oral q1800 Jonah Blue, MD   20 mg at 08/14/17 1718  . pregabalin (LYRICA) capsule 75 mg  75 mg Oral Daily Jonah Blue, MD   75 mg at 08/15/17 1130     Discharge Medications: Please see discharge summary for a list of discharge medications.  Relevant Imaging Results:  Relevant Lab Results:   Additional Information SS#[id number]  Althea Charon, LCSW

## 2017-08-15 NOTE — Progress Notes (Signed)
Report called to Byrd Hesselbach at [facility].

## 2017-08-15 NOTE — Progress Notes (Signed)
MD to floor to assess patient.  Patient still lying in bed with no distress at this time and no complaints.  Patient is still tachycardic, no respiratory distress noted.  Order received for bolus, will give and continue to monitor patient.

## 2017-08-15 NOTE — Discharge Summary (Addendum)
Physician Discharge Summary  [name] [name]:[id number] DOB: June 27, 1921 DOA: 08/13/2017  PCP: [name], MD  Admit date: 08/13/2017 Discharge date: 08/15/2017  Admitted From: Home  Disposition:  (Home  Recommendations for Outpatient Follow-up:  1. Follow up with PCP in 1-2 weeks    Home Health yes / Home health therapy . Equipment/Devices:NO  Discharge Condition:Stable CODE STATUS: DNR Diet recommendation: Heart Healthy /   Brief/Interim Summary:   83 y.o.malewith medical history significant of dementia; DM; symptomatic bradycardia s/p dual-chamber pacemaker placement; PAD; OSA not on CPAP; HLD; visual impairment associated with macular degeneration; diastolic heart failure (grade 1 by echo in 2013, preserved EF at that time); and CAD presents with CHF exacerbation and B/L pleural effusions . Was admitted with diuresis with Lasix and negative 4 liters and subsequent improvement in his shortness of breath , hospital course has complicated with episode of A. fib/SVT required Lopressor subsequent complete resolve of that tachycardia the patient is completely asymptomatic breathing comfortably on room air and he will be discharged to follow-up as outpatient will prescribe him Lasix to take as an outpatient.  Discharge Diagnoses:   Acute on chronic respiratory failure with hypoxia associated with heart failure and pleural effusion  -Responds well to the Lasix diuresis -4 L overnight shortness of breath resolved.  -DM -Continue Lantus    HTN  WELL CONTROLLED. -Lopressor 12.5 mg BID. -Continue ACE  Dementia  -Family denies behavioral issues -Continue Aricept    Discharge Instructions  Discharge Instructions    Diet - low sodium heart healthy    Complete by:  As directed    Increase activity slowly    Complete by:  As directed      Allergies as of 08/15/2017      Reactions   Amlodipine    Bextra [valdecoxib]    Diclofenac    Gabapentin     REACTION: hallucinate   Zocor [simvastatin]       Medication List    STOP taking these medications   loratadine 10 MG tablet Commonly known as:  CLARITIN     TAKE these medications   benazepril 20 MG tablet Commonly known as:  LOTENSIN Take 20 mg by mouth every morning.   COLACE PO Take by mouth daily.   donepezil 10 MG tablet Commonly known as:  ARICEPT Take 10 mg by mouth at bedtime.   furosemide 20 MG tablet Commonly known as:  LASIX Take 1 tablet (20 mg total) by mouth 2 (two) times daily. What changed:  when to take this   insulin aspart 100 UNIT/ML injection Commonly known as:  novoLOG Inject into the skin. Sliding scale   insulin glargine 100 UNIT/ML injection Commonly known as:  LANTUS Inject 15 Units into the skin 2 (two) times daily.   metoprolol tartrate 25 MG tablet Commonly known as:  LOPRESSOR Take 25 mg by mouth daily.   potassium chloride 10 MEQ tablet Commonly known as:  K-DUR,KLOR-CON Take 1 tablet (10 mEq total) by mouth 2 (two) times daily. What changed:  when to take this   pravastatin 20 MG tablet Commonly known as:  PRAVACHOL Take 20 mg by mouth daily.   pregabalin 75 MG capsule Commonly known as:  LYRICA Take 75 mg by mouth daily.       Allergies  Allergen Reactions  . Amlodipine   . Bextra [Valdecoxib]   . Diclofenac   . Gabapentin     REACTION: hallucinate  . Zocor [Simvastatin]  Consultations:  NONE     Procedures/Studies: Dg Chest 1 View  Result Date: 08/13/2017 CLINICAL DATA:  LEFT pneumothorax post thoracentesis with removal of 1.7 L of LEFT pleural fluid EXAM: CHEST 1 VIEW COMPARISON:  Earlier study of 08/13/2017 FINDINGS: Small LEFT apical and basilar LEFT pneumothorax identified. No mediastinal shift. Patient was asymptomatic at time of radiograph. Persistent pleural effusion at LEFT base with partial atelectasis of the lower LEFT lung. Probable small subpulmonic RIGHT pleural effusion. Question mild  pulmonary edema in RIGHT lung. Stable LEFT subclavian transvenous pacemaker leads projecting over RIGHT atrium and RIGHT ventricle. Dense atherosclerotic calcification aorta. IMPRESSION: New small LEFT apical and basilar pneumothorax post LEFT thoracentesis and removal of 1.7 L of fluid without mediastinal shift/evidence of tension. Residual LEFT basilar pleural effusion and atelectasis. Followup chest radiograph ordered in 2 hours to assess stability. Electronically Signed   By: Ulyses Southward M.D.   On: 08/13/2017 18:04   Dg Chest 2 View  Result Date: 08/13/2017 CLINICAL DATA:  Shortness of breath. EXAM: CHEST  2 VIEW COMPARISON:  Chest x-ray dated May 03, 2017. FINDINGS: Left chest wall AICD with leads terminating in the right atrium and right ventricle, unchanged. Cardiomegaly. New moderate to large left pleural effusion with dense opacification of the lingula and left lower lobe. Right basilar atelectasis. The right lung is otherwise clear. No pneumothorax. No acute osseous abnormality. Status post cholecystectomy. IMPRESSION: New moderate to large left pleural effusion with adjacent opacification of the lingula and left lower lobe, favored to represent atelectasis. Superimposed pneumonia cannot be excluded. Electronically Signed   By: Obie Dredge M.D.   On: 08/13/2017 13:51   Dg Chest Port 1 View  Result Date: 08/14/2017 CLINICAL DATA:  Follow-up pneumothorax EXAM: PORTABLE CHEST 1 VIEW COMPARISON:  08/13/2017 FINDINGS: Cardiac shadow is again enlarged. Pacing device is again seen. Bilateral pleural effusions are noted left greater than right stable left pneumothorax is noted. No new focal abnormality is noted. Aortic calcifications are again seen. IMPRESSION: Stable bilateral pleural effusions left greater than right. Small pneumothorax on the left stable from the prior exam. Electronically Signed   By: Alcide Clever M.D.   On: 08/14/2017 06:59   Dg Chest Port 1 View  Result Date:  08/13/2017 CLINICAL DATA:  Thoracentesis with left pneumothorax EXAM: PORTABLE CHEST 1 VIEW COMPARISON:  08/13/2017 FINDINGS: Small right pleural effusion. Moderate left pleural effusion without significant interval change. No significant interval change in size of small left apical and lower pneumothorax. No midline shift. Slight increased consolidation at the left lung base. Cardiomegaly with atherosclerosis. Central vascular congestion and slight increased pulmonary edema. IMPRESSION: 1. No significant interval change in size of small left pneumothorax. No midline shift 2. Residual left greater than right pleural effusions with worsened consolidation at the left base 3. Cardiomegaly with central vascular congestion and increased pulmonary edema since the prior study. Electronically Signed   By: Jasmine Pang M.D.   On: 08/13/2017 20:20   Dg Chest Port 1v Same Day  Result Date: 08/13/2017 CLINICAL DATA:  Status post thoracentesis. Shortness of breath. Initial encounter. EXAM: PORTABLE CHEST 1 VIEW COMPARISON:  Chest radiograph performed earlier today at 7:55 p.m. FINDINGS: Small right and small to moderate left-sided pleural effusions are noted. Bibasilar airspace opacities may reflect asymmetric pulmonary edema or pneumonia. The previously noted small left-sided pneumothorax has decreased in size. The cardiomediastinal silhouette is borderline normal in size. A pacemaker is noted overlying the left chest wall, with leads ending overlying the  right atrium and right ventricle. No acute osseous abnormalities are seen. IMPRESSION: 1. Small right and small to moderate left-sided pleural effusions noted. Bibasilar airspace opacities may reflect asymmetric pulmonary edema or pneumonia. 2. Previously noted small left-sided pneumothorax has decreased in size. Electronically Signed   By: Roanna Raider M.D.   On: 08/13/2017 22:09   US Thoracentesis Asp Pleural Space W/img Guide  Result Date: 08/13/2017 INDICATION:  Shortness of breath, large LEFT pleural effusion on chest radiograph EXAM: ULTRASOUND GUIDED DIAGNOSTIC AND THERAPEUTIC LEFT THORACENTESIS MEDICATIONS: None. COMPLICATIONS: None immediate. PROCEDURE: Procedure, benefits, and risks of procedure were discussed with patient, patient's wife and patient's daughter. Written informed consent for procedure was obtained. Time out protocol followed. Pleural effusion localized by ultrasound at the posterior LEFT hemithorax. Skin prepped and draped in usual sterile fashion. Skin and soft tissues anesthetized with 10 mL of 1% lidocaine. 8 French thoracentesis catheter placed into the LEFT pleural space. 1.7 L of clear yellow LEFT pleural fluid aspirated by syringe pump. Procedure tolerated well by patient. Postprocedural chest radiograph demonstrates presence of a small LEFT pneumothorax; patient is asymptomatic. FINDINGS: A total of approximately 1.7 L of LEFT pleural fluid was removed. Samples were sent to the laboratory as requested by the clinical team. IMPRESSION: Successful ultrasound guided LEFT thoracentesis yielding 1.7 L of pleural fluid. Electronically Signed   By: Ulyses Southward M.D.   On: 08/13/2017 17:55    (Echo, Carotid, EGD, Colonoscopy, ERCP)    Subjective:   Discharge Exam: Vitals:   08/15/17 0150 08/15/17 0500  BP:  120/63  Pulse: 75 71  Resp:  15  Temp:  98.7 F (37.1 C)  SpO2:  99%   Vitals:   08/14/17 2300 08/15/17 0122 08/15/17 0150 08/15/17 0500  BP: 98/68 115/71  120/63  Pulse:   75 71  Resp:    15  Temp:    98.7 F (37.1 C)  TempSrc:    Oral  SpO2:    99%  Weight:    76.8 kg (169 lb 5 oz)    General: Pt is alert, awake, not in acute distress Cardiovascular: RRR, S1/S2 +, no rubs, no gallops Respiratory: CTA bilaterally, no wheezing, no rhonchi Abdominal: Soft, NT, ND, bowel sounds + Extremities: no edema, no cyanosis    The results of significant diagnostics from this hospitalization (including imaging, microbiology,  ancillary and laboratory) are listed below for reference.     Microbiology: Recent Results (from the past 240 hour(s))  Gram stain     Status: None   Collection Time: 08/13/17  5:06 PM  Result Value Ref Range Status   Specimen Description PLEURAL  Final   Special Requests NONE  Final   Gram Stain   Final    NO ORGANISMS SEEN NO WBC SEEN Performed at [facility]    Report Status 08/13/2017 FINAL  Final  Culture, body fluid-bottle     Status: None (Preliminary result)   Collection Time: 08/13/17  5:07 PM  Result Value Ref Range Status   Specimen Description PLEURAL  Final   Special Requests BOTTLES DRAWN AEROBIC AND ANAEROBIC 10CC  Final   Culture NO GROWTH 2 DAYS  Final   Report Status PENDING  Incomplete  MRSA PCR Screening     Status: None   Collection Time: 08/13/17 11:55 PM  Result Value Ref Range Status   MRSA by PCR NEGATIVE NEGATIVE Final    Comment:        The GeneXpert MRSA Assay (FDA  approved for NASAL specimens only), is one component of a comprehensive MRSA colonization surveillance program. It is not intended to diagnose MRSA infection nor to guide or monitor treatment for MRSA infections.      Labs: BNP (last 3 results)  Recent Labs  08/13/17 1259  BNP 1,904.0*   Basic Metabolic Panel:  Recent Labs Lab 08/13/17 1259 08/14/17 0253 08/15/17 0618  NA 126* 127* 130*  K 4.8 3.8 3.4*  CL 94* 94* 94*  CO2 24 26 28   GLUCOSE 228* 177* 83  BUN 15 16 18   CREATININE 1.10 1.17 1.22  CALCIUM 8.3* 8.2* 8.0*   Liver Function Tests:  Recent Labs Lab 08/13/17 1259  AST 14*  ALT 16*  ALKPHOS 57  BILITOT 0.9  PROT 6.4*  ALBUMIN 3.2*   No results for input(s): LIPASE, AMYLASE in the last 168 hours. No results for input(s): AMMONIA in the last 168 hours. CBC:  Recent Labs Lab 08/13/17 1259 08/14/17 0253 08/15/17 0618  WBC 5.8 7.1 5.2  NEUTROABS 4.7 6.0  --   HGB 11.3* 10.9* 10.6*  HCT 33.3* 30.9* 30.9*  MCV 89.5 89.0 89.8  PLT  216 198 230   Cardiac Enzymes:  Recent Labs Lab 08/13/17 1259 08/13/17 2057 08/14/17 0253 08/14/17 0852  TROPONINI 0.03* 0.04* 0.04* 0.04*   BNP: Invalid input(s): POCBNP CBG:  Recent Labs Lab 08/14/17 0741 08/14/17 1115 08/14/17 1629 08/15/17 0723 08/15/17 1123  GLUCAP 171* 200* 145* 62* 263*   D-Dimer No results for input(s): DDIMER in the last 72 hours. Hgb A1c No results for input(s): HGBA1C in the last 72 hours. Lipid Profile No results for input(s): CHOL, HDL, LDLCALC, TRIG, CHOLHDL, LDLDIRECT in the last 72 hours. Thyroid function studies No results for input(s): TSH, T4TOTAL, T3FREE, THYROIDAB in the last 72 hours.  Invalid input(s): FREET3 Anemia work up No results for input(s): VITAMINB12, FOLATE, FERRITIN, TIBC, IRON, RETICCTPCT in the last 72 hours. Urinalysis No results found for: COLORURINE, APPEARANCEUR, LABSPEC, PHURINE, GLUCOSEU, HGBUR, BILIRUBINUR, KETONESUR, PROTEINUR, UROBILINOGEN, NITRITE, LEUKOCYTESUR Sepsis Labs Invalid input(s): PROCALCITONIN,  WBC,  LACTICIDVEN Microbiology Recent Results (from the past 240 hour(s))  Gram stain     Status: None   Collection Time: 08/13/17  5:06 PM  Result Value Ref Range Status   Specimen Description PLEURAL  Final   Special Requests NONE  Final   Gram Stain   Final    NO ORGANISMS SEEN NO WBC SEEN Performed at [facility]    Report Status 08/13/2017 FINAL  Final  Culture, body fluid-bottle     Status: None (Preliminary result)   Collection Time: 08/13/17  5:07 PM  Result Value Ref Range Status   Specimen Description PLEURAL  Final   Special Requests BOTTLES DRAWN AEROBIC AND ANAEROBIC 10CC  Final   Culture NO GROWTH 2 DAYS  Final   Report Status PENDING  Incomplete  MRSA PCR Screening     Status: None   Collection Time: 08/13/17 11:55 PM  Result Value Ref Range Status   MRSA by PCR NEGATIVE NEGATIVE Final    Comment:        The GeneXpert MRSA Assay (FDA approved for NASAL  specimens only), is one component of a comprehensive MRSA colonization surveillance program. It is not intended to diagnose MRSA infection nor to guide or monitor treatment for MRSA infections.      Time coordinating discharge: Over 30 minutes  SIGNED:   Efrain Sella, MD  [facility] 08/15/2017, 1:07 PM Pager  If 7PM-7AM, please contact night-coverage [email or web address] Password [email or web address]

## 2017-08-15 NOTE — Clinical Social Work Note (Addendum)
Clinical Social Work Assessment  Patient Details  Name: [name] [name] MRN: [id number] Date of Birth: 10-01-1921  Date of referral:  08/15/17               Reason for consult:  Discharge Planning                Permission sought to share information with:  Family Supports Permission granted to share information::     Name::     Lorrin Jackson  Agency::  ALF  Relationship::  Daughter  Contact Information:  [phone]  Housing/Transportation Living arrangements for the past 2 months:  Assisted Living Facility Event organiser) Source of Information:  Adult Children Patient Interpreter Needed:  None Criminal Activity/Legal Involvement Pertinent to Current Situation/Hospitalization:  No - Comment as needed Significant Relationships:  Adult Children, Spouse Lives with:  Facility Resident Do you feel safe going back to the place where you live?  Yes Need for family participation in patient care:  Yes (Comment) (Dementia)  Care giving concerns:  No care giving concerns identified.    Social Worker assessment / plan:  Assessment completed remotely. Pt oriented x1 and has Dementia. RN called CSW stating family wants pt to go to SNF. P/T rec supervision/assistance, O/T rec HHPT. CSW called room [phone]) and spoke with daughter-Janet Odell who was at bedside with pt and spouse-[name] Barcus. Daughter expressed concerns of pt's need for higher level of care. CSW informed pt that ALF can increase personal care needs, which would likely result in increased cost. CSW also informed that HHPT can be ordered through ALF. CSW stated that daughter can speak with SNF on Monday, if she still feels higher level of care is needed. Daughter expressed understanding and agreement.   CSW spoke with Alcario Drought at [facility] to confirm pt's return to ALF today, needs for higher personal care assistance, and HHPT. Erica confirmed [facility] would be able to provide increased needs. Alcario Drought states home health PT would  need to be on discharge summary. CSW spoke with Dr. Harle Stanford to update and ask for HHPT to be added to d/c summary. CSW faxed updated d/c summary, FL-2, and therapy notes to [facility]. RN updated and to call report to [facility] at [phone] for room 43. RN to arranged non-emergency ambulance for pt transport back to ALF. CSW signing off as no further Social Work needs identified.   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   Patient/Family's Understanding of and Emotional Response to Diagnosis, Current Treatment, and Prognosis:  Daughter expressed understanding of pt's current medical state.   Emotional Assessment Appearance:  Appears stated age Attitude/Demeanor/Rapport:  Unable to Assess Affect (typically observed):  Unable to Assess Orientation:  Oriented to Self Alcohol / Substance use:  Other Psych involvement (Current and /or in the community):  No (Comment)  Discharge Needs  Concerns to be addressed:  Discharge Planning Concerns, Care Coordination Readmission within the last 30 days:  No Current discharge risk:  Dependent with Mobility, Cognitively Impaired Barriers to Discharge:  Continued Medical Work up   [employer], LCSW 08/15/2017, 3:45 PM

## 2017-08-16 NOTE — Progress Notes (Signed)
PT leaving unit to return to [facility] via EMS. PT stable. All medications due for 08/15/17 p.m. Were given. Recent vs reported to paramedics along with list of medications given and a note stating medications to be given starting the morning of 08/16/2017. Pt telemetry removed. IV removed by day nurse. Daughter aware of patients return to [facility].

## 2017-08-18 LAB — CULTURE, BODY FLUID W GRAM STAIN -BOTTLE: Culture: NO GROWTH

## 2017-09-15 ENCOUNTER — Telehealth: Payer: Self-pay

## 2017-09-15 NOTE — Telephone Encounter (Signed)
Nurse manager contacted office stating yesterday that patient was very diaphoretic and BP yesterday was 70/40 manually. Nursing staff laid the patient down and when they rechecked the BP 30 minutes later it went back to normal. Patient had another episode today where BP was 80/50 manually. BS has been running around 200 and HR around 100.

## 2017-09-15 NOTE — Telephone Encounter (Signed)
Left message for nurse manager.

## 2017-09-15 NOTE — Telephone Encounter (Signed)
I would make sure that this is being relayed to the health care provider that is covering this patient at the nursing facility. It sounds like he may be developing some orthostatic intolerance and could require medication adjustments.

## 2017-09-16 NOTE — Telephone Encounter (Signed)
[name] [name] with [facility] nursing center notified.  Stated they are getting UA as well.  Appointment has already been scheduled for 09/24/2017 with Dr. Diona Browner.

## 2017-09-24 ENCOUNTER — Ambulatory Visit: Payer: Medicare Other | Admitting: Cardiology

## 2017-09-28 DEATH — deceased

## 2017-11-03 IMAGING — CR DG CHEST 1V PORT SAME DAY
1 series · 1 of 1 positions shown · non-contrast
Comparison: Chest radiograph performed earlier today at [DATE] p.m.

CLINICAL DATA: Status post thoracentesis. Shortness of breath.
Initial encounter.

EXAM:
PORTABLE CHEST 1 VIEW

[ap portable]
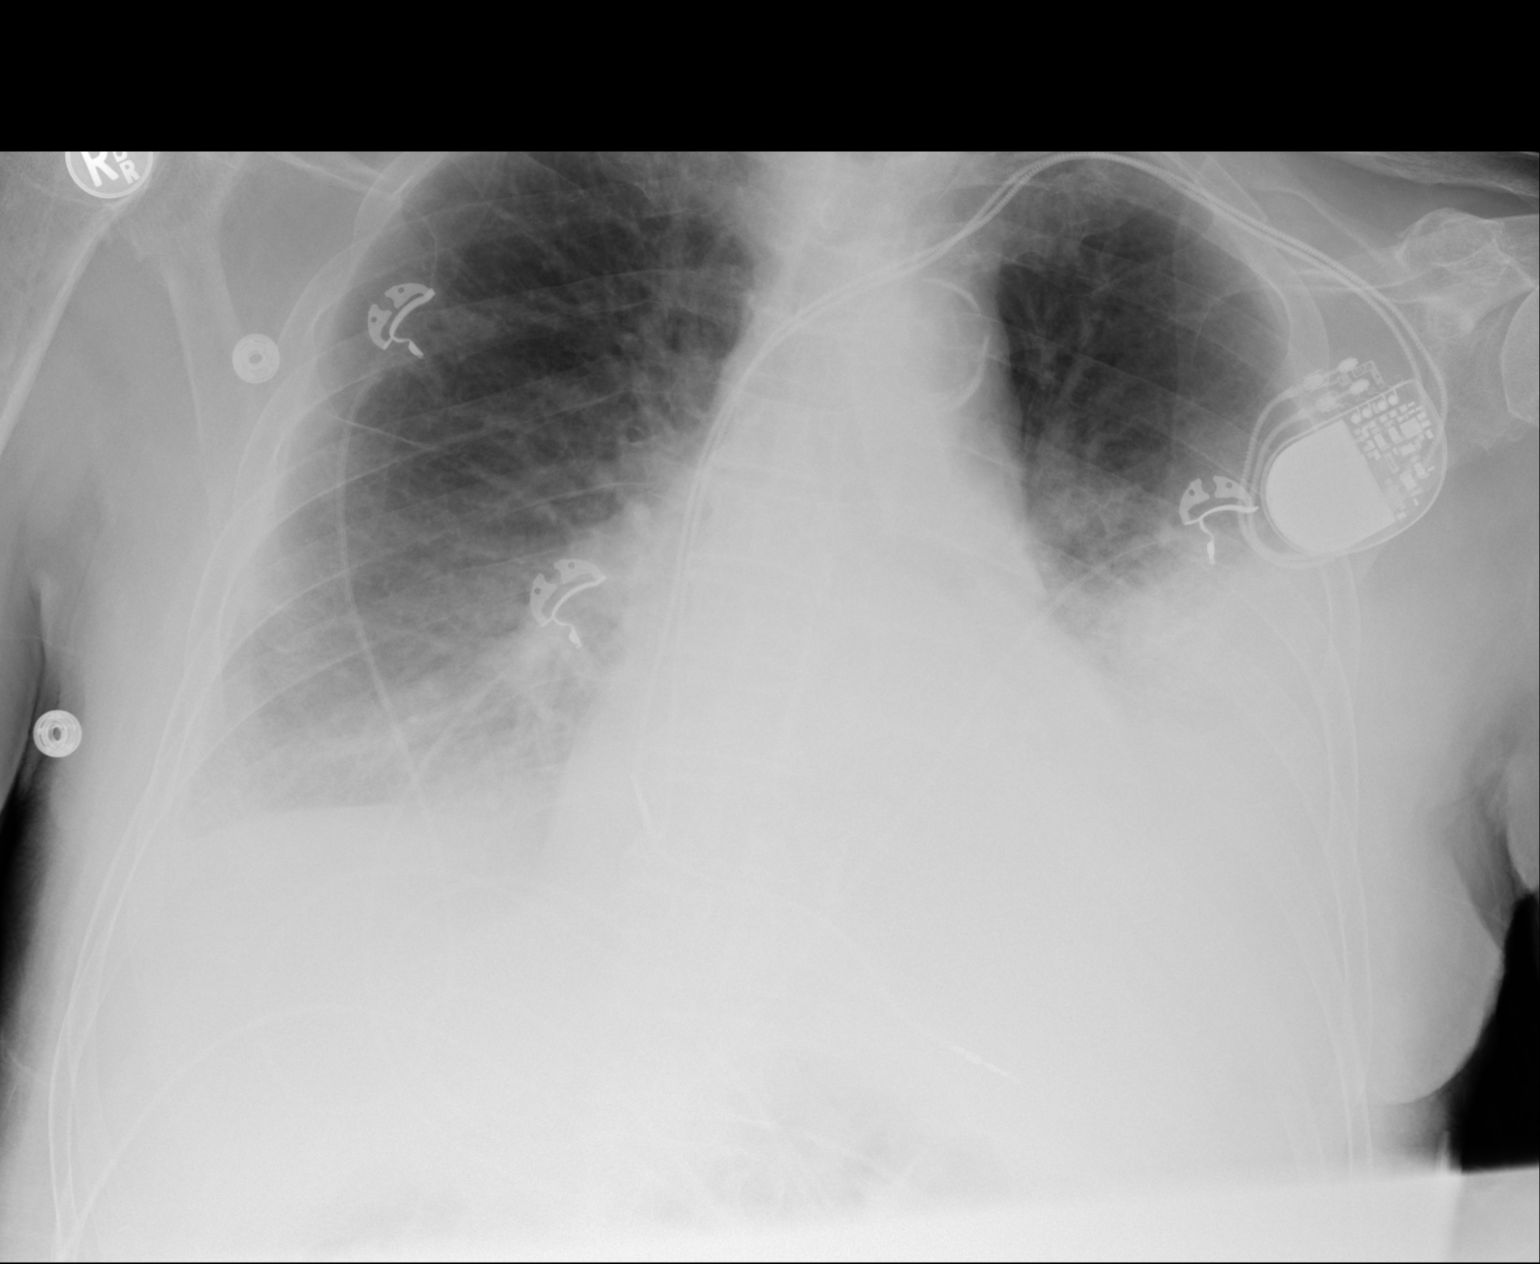

[1 of 1 positions shown; findings below may reference images not displayed]

FINDINGS: Small right and small to moderate left-sided pleural effusions are
noted. Bibasilar airspace opacities may reflect asymmetric pulmonary
edema or pneumonia. The previously noted small left-sided
pneumothorax has decreased in size.

The cardiomediastinal silhouette is borderline normal in size. A
pacemaker is noted overlying the left chest wall, with leads ending
overlying the right atrium and right ventricle. No acute osseous
abnormalities are seen.
IMPRESSION: 1. Small right and small to moderate left-sided pleural effusions
noted. Bibasilar airspace opacities may reflect asymmetric pulmonary
edema or pneumonia.
2. Previously noted small left-sided pneumothorax has decreased in
size.

## 2017-11-04 IMAGING — CR DG CHEST 1V PORT
1 series · 1 of 1 positions shown · non-contrast
Comparison: 08/13/2017

CLINICAL DATA: Follow-up pneumothorax

EXAM:
PORTABLE CHEST 1 VIEW

[portable]
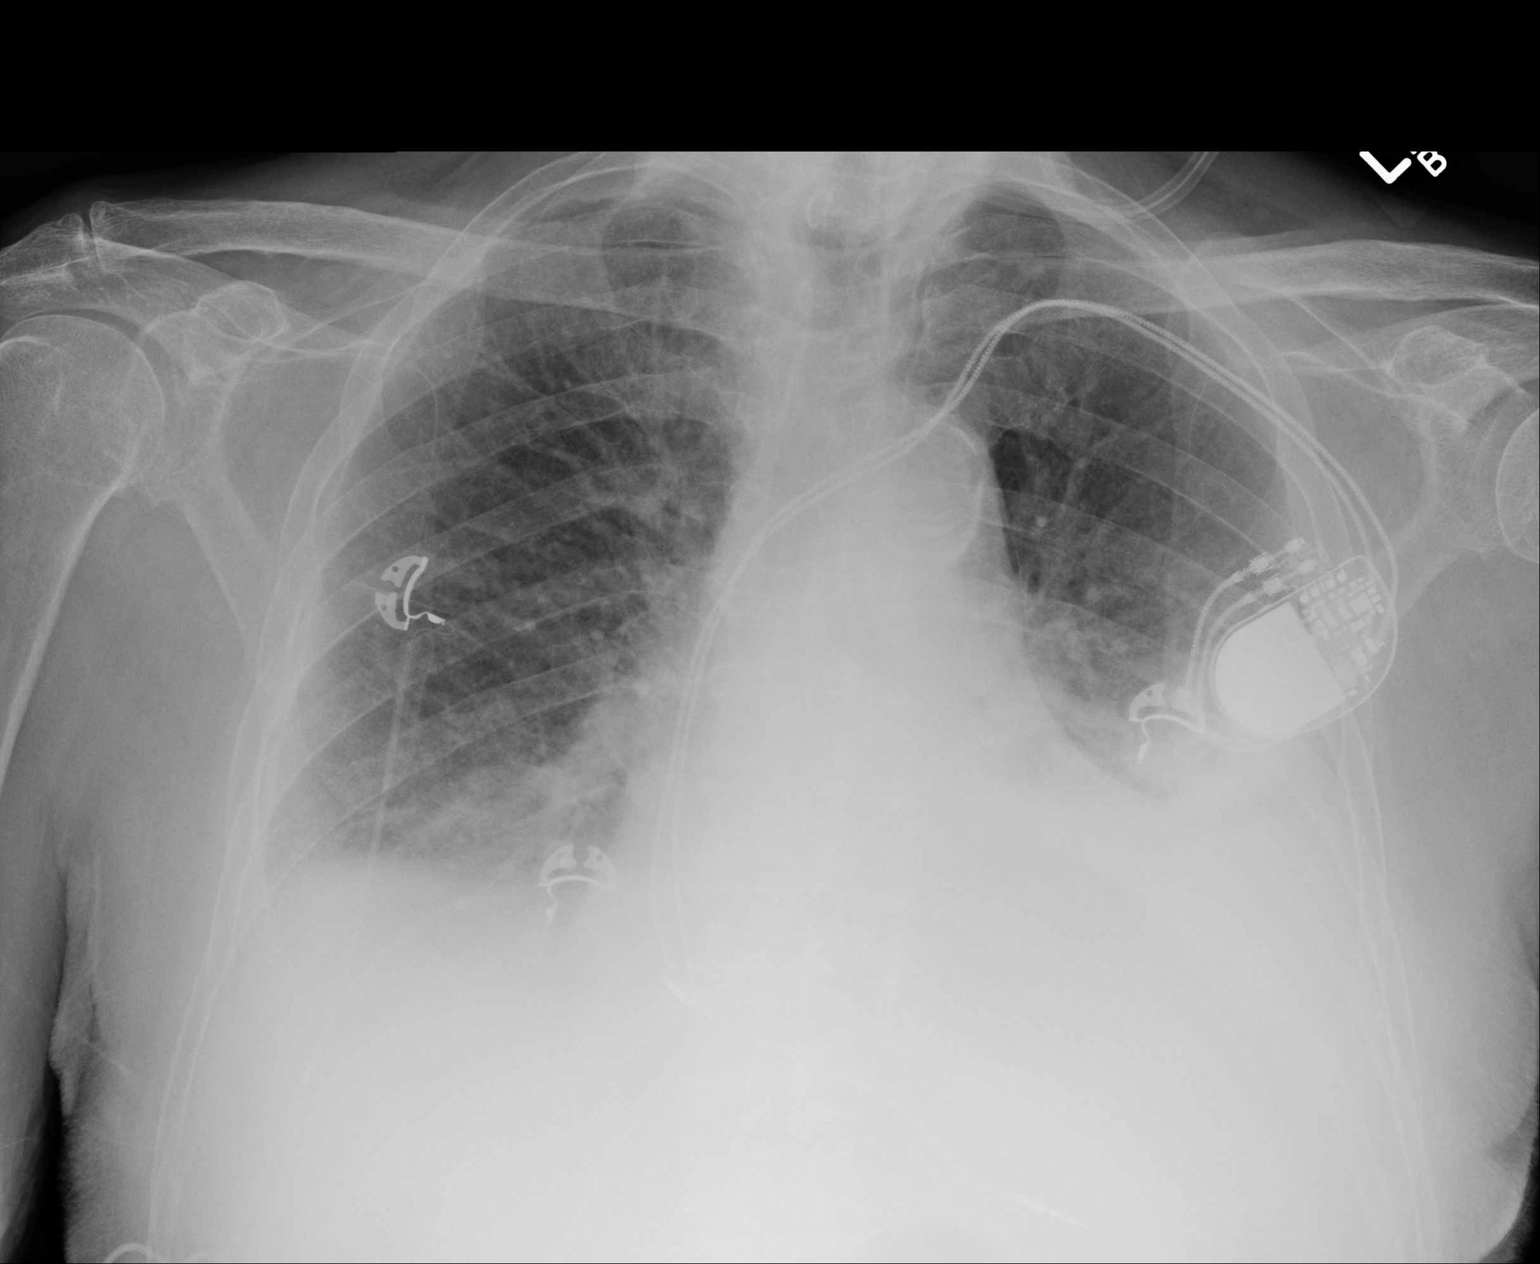

[1 of 1 positions shown; findings below may reference images not displayed]

FINDINGS: Cardiac shadow is again enlarged. Pacing device is again seen.
Bilateral pleural effusions are noted left greater than right stable
left pneumothorax is noted. No new focal abnormality is noted.
Aortic calcifications are again seen.
IMPRESSION: Stable bilateral pleural effusions left greater than right.

Small pneumothorax on the left stable from the prior exam.

## 2018-06-04 ENCOUNTER — Encounter: Payer: Medicare Other | Admitting: Internal Medicine
